# Patient Record
Sex: Female | Born: 1970 | Race: White | Hispanic: No | Marital: Married | Smoking: Never smoker
Health system: Southern US, Community
[De-identification: ages and names within clinical notes are randomized; demographics above are authoritative.]

## PROBLEM LIST (undated history)

## (undated) DIAGNOSIS — Z803 Family history of malignant neoplasm of breast: Secondary | ICD-10-CM

## (undated) DIAGNOSIS — Z800 Family history of malignant neoplasm of digestive organs: Secondary | ICD-10-CM

## (undated) DIAGNOSIS — C801 Malignant (primary) neoplasm, unspecified: Secondary | ICD-10-CM

## (undated) DIAGNOSIS — E042 Nontoxic multinodular goiter: Secondary | ICD-10-CM

## (undated) HISTORY — DX: Family history of malignant neoplasm of digestive organs: Z80.0

## (undated) HISTORY — DX: Family history of malignant neoplasm of breast: Z80.3

## (undated) HISTORY — PX: KNEE ARTHROSCOPY: SUR90

---

## 2006-01-04 ENCOUNTER — Other Ambulatory Visit: Admission: RE | Admit: 2006-01-04 | Discharge: 2006-01-04 | Payer: Self-pay | Admitting: Obstetrics & Gynecology

## 2006-01-31 ENCOUNTER — Encounter: Admission: RE | Admit: 2006-01-31 | Discharge: 2006-01-31 | Payer: Self-pay | Admitting: Emergency Medicine

## 2006-04-22 ENCOUNTER — Encounter: Admission: RE | Admit: 2006-04-22 | Discharge: 2006-04-22 | Payer: Self-pay | Admitting: Obstetrics & Gynecology

## 2007-11-21 ENCOUNTER — Encounter: Admission: RE | Admit: 2007-11-21 | Discharge: 2007-11-21 | Payer: Self-pay | Admitting: Obstetrics and Gynecology

## 2008-05-31 ENCOUNTER — Encounter: Admission: RE | Admit: 2008-05-31 | Discharge: 2008-05-31 | Payer: Self-pay | Admitting: Obstetrics and Gynecology

## 2009-01-07 ENCOUNTER — Ambulatory Visit (HOSPITAL_COMMUNITY): Admission: RE | Admit: 2009-01-07 | Discharge: 2009-01-07 | Payer: Self-pay | Admitting: Gynecology

## 2010-04-06 ENCOUNTER — Ambulatory Visit: Payer: Self-pay | Admitting: Obstetrics and Gynecology

## 2010-04-06 ENCOUNTER — Inpatient Hospital Stay (HOSPITAL_COMMUNITY): Admission: AD | Admit: 2010-04-06 | Discharge: 2010-04-06 | Payer: Self-pay | Admitting: Obstetrics and Gynecology

## 2010-04-15 ENCOUNTER — Ambulatory Visit: Payer: Self-pay | Admitting: Obstetrics & Gynecology

## 2010-04-19 ENCOUNTER — Inpatient Hospital Stay (HOSPITAL_COMMUNITY): Admission: AD | Admit: 2010-04-19 | Discharge: 2010-04-23 | Payer: Self-pay | Admitting: Obstetrics and Gynecology

## 2010-04-21 ENCOUNTER — Encounter: Payer: Self-pay | Admitting: Obstetrics and Gynecology

## 2010-04-30 ENCOUNTER — Inpatient Hospital Stay (HOSPITAL_COMMUNITY): Admission: AD | Admit: 2010-04-30 | Discharge: 2010-04-30 | Payer: Self-pay | Admitting: Obstetrics and Gynecology

## 2010-05-06 ENCOUNTER — Ambulatory Visit: Payer: Self-pay | Admitting: Obstetrics & Gynecology

## 2010-05-12 ENCOUNTER — Ambulatory Visit: Payer: Self-pay | Admitting: Obstetrics and Gynecology

## 2010-05-12 ENCOUNTER — Other Ambulatory Visit: Payer: Self-pay | Admitting: Obstetrics and Gynecology

## 2010-05-15 ENCOUNTER — Encounter (INDEPENDENT_AMBULATORY_CARE_PROVIDER_SITE_OTHER): Payer: Self-pay | Admitting: Obstetrics and Gynecology

## 2010-05-15 ENCOUNTER — Inpatient Hospital Stay (HOSPITAL_COMMUNITY): Admission: AD | Admit: 2010-05-15 | Discharge: 2010-05-18 | Payer: Self-pay | Admitting: Obstetrics and Gynecology

## 2010-05-18 ENCOUNTER — Encounter: Admission: RE | Admit: 2010-05-18 | Discharge: 2010-06-17 | Payer: Self-pay | Admitting: Obstetrics and Gynecology

## 2010-06-18 ENCOUNTER — Encounter
Admission: RE | Admit: 2010-06-18 | Discharge: 2010-07-18 | Payer: Self-pay | Source: Home / Self Care | Attending: Obstetrics and Gynecology | Admitting: Obstetrics and Gynecology

## 2010-07-19 ENCOUNTER — Encounter
Admission: RE | Admit: 2010-07-19 | Discharge: 2010-08-18 | Payer: Self-pay | Source: Home / Self Care | Attending: Obstetrics and Gynecology | Admitting: Obstetrics and Gynecology

## 2010-08-19 ENCOUNTER — Encounter
Admission: RE | Admit: 2010-08-19 | Discharge: 2010-08-27 | Payer: Self-pay | Source: Home / Self Care | Attending: Obstetrics and Gynecology | Admitting: Obstetrics and Gynecology

## 2010-10-14 LAB — CBC
HCT: 28.4 % — ABNORMAL LOW (ref 36.0–46.0)
HCT: 29.5 % — ABNORMAL LOW (ref 36.0–46.0)
Hemoglobin: 10.2 g/dL — ABNORMAL LOW (ref 12.0–15.0)
Hemoglobin: 9.6 g/dL — ABNORMAL LOW (ref 12.0–15.0)
MCH: 33.9 pg (ref 26.0–34.0)
MCHC: 34 g/dL (ref 30.0–36.0)
MCV: 100.6 fL — ABNORMAL HIGH (ref 78.0–100.0)
MCV: 99.8 fL (ref 78.0–100.0)
Platelets: 101 10*3/uL — ABNORMAL LOW (ref 150–400)
RBC: 2.94 MIL/uL — ABNORMAL LOW (ref 3.87–5.11)
RDW: 13.8 % (ref 11.5–15.5)
RDW: 14.3 % (ref 11.5–15.5)
WBC: 10.8 10*3/uL — ABNORMAL HIGH (ref 4.0–10.5)

## 2010-10-15 LAB — URINALYSIS, ROUTINE W REFLEX MICROSCOPIC
Leukocytes, UA: NEGATIVE
Specific Gravity, Urine: 1.015 (ref 1.005–1.030)
Specific Gravity, Urine: <1.005 — ABNORMAL LOW (ref 1.005–1.030)
Urobilinogen, UA: 0.2 mg/dL (ref 0.0–1.0)
pH: 6 (ref 5.0–8.0)
pH: 7 (ref 5.0–8.0)

## 2010-10-15 LAB — SURGICAL PCR SCREEN: MRSA, PCR: NEGATIVE

## 2010-10-15 LAB — CBC
Hemoglobin: 12.8 g/dL (ref 12.0–15.0)
MCH: 34.3 pg — ABNORMAL HIGH (ref 26.0–34.0)
RDW: 13.8 % (ref 11.5–15.5)
WBC: 9.4 10*3/uL (ref 4.0–10.5)

## 2010-10-15 LAB — RPR: RPR Ser Ql: NONREACTIVE

## 2010-10-15 LAB — URINE MICROSCOPIC-ADD ON

## 2010-10-15 LAB — FETAL FIBRONECTIN: Fetal Fibronectin: NEGATIVE

## 2010-10-15 LAB — WET PREP, GENITAL
Clue Cells Wet Prep HPF POC: NONE SEEN
Trich, Wet Prep: NONE SEEN
Yeast Wet Prep HPF POC: NONE SEEN

## 2011-11-23 ENCOUNTER — Other Ambulatory Visit: Payer: Self-pay | Admitting: Obstetrics and Gynecology

## 2012-02-18 ENCOUNTER — Other Ambulatory Visit: Payer: Self-pay | Admitting: Family Medicine

## 2012-02-18 DIAGNOSIS — E010 Iodine-deficiency related diffuse (endemic) goiter: Secondary | ICD-10-CM

## 2012-02-21 ENCOUNTER — Ambulatory Visit
Admission: RE | Admit: 2012-02-21 | Discharge: 2012-02-21 | Disposition: A | Discharge status: Home / Self Care | Payer: 59 | Source: Ambulatory Visit | Attending: Family Medicine | Admitting: Family Medicine

## 2012-02-21 DIAGNOSIS — E010 Iodine-deficiency related diffuse (endemic) goiter: Secondary | ICD-10-CM

## 2012-02-24 ENCOUNTER — Encounter (HOSPITAL_COMMUNITY): Payer: Self-pay | Admitting: Pharmacy Technician

## 2012-02-28 ENCOUNTER — Other Ambulatory Visit: Payer: Self-pay | Admitting: Otolaryngology

## 2012-02-28 DIAGNOSIS — E042 Nontoxic multinodular goiter: Secondary | ICD-10-CM

## 2012-02-29 ENCOUNTER — Other Ambulatory Visit (HOSPITAL_COMMUNITY)
Admission: RE | Admit: 2012-02-29 | Discharge: 2012-02-29 | Disposition: A | Discharge status: Home / Self Care | Payer: 59 | Source: Ambulatory Visit | Attending: Interventional Radiology | Admitting: Interventional Radiology

## 2012-02-29 ENCOUNTER — Ambulatory Visit
Admission: RE | Admit: 2012-02-29 | Discharge: 2012-02-29 | Disposition: A | Discharge status: Home / Self Care | Payer: 59 | Source: Ambulatory Visit | Attending: Otolaryngology | Admitting: Otolaryngology

## 2012-02-29 DIAGNOSIS — E049 Nontoxic goiter, unspecified: Secondary | ICD-10-CM | POA: Insufficient documentation

## 2012-02-29 DIAGNOSIS — E042 Nontoxic multinodular goiter: Secondary | ICD-10-CM

## 2012-03-06 ENCOUNTER — Other Ambulatory Visit: Payer: Self-pay | Admitting: Otolaryngology

## 2012-03-07 ENCOUNTER — Encounter (HOSPITAL_COMMUNITY): Payer: Self-pay

## 2012-03-07 ENCOUNTER — Encounter (HOSPITAL_COMMUNITY)
Admission: RE | Admit: 2012-03-07 | Discharge: 2012-03-07 | Disposition: A | Discharge status: Home / Self Care | Payer: 59 | Source: Ambulatory Visit | Attending: Otolaryngology | Admitting: Otolaryngology

## 2012-03-07 HISTORY — DX: Nontoxic multinodular goiter: E04.2

## 2012-03-07 HISTORY — DX: Malignant (primary) neoplasm, unspecified: C80.1

## 2012-03-07 LAB — CBC
Hemoglobin: 13.9 g/dL (ref 12.0–15.0)
MCHC: 34.2 g/dL (ref 30.0–36.0)
Platelets: 164 10*3/uL (ref 150–400)
RDW: 12.8 % (ref 11.5–15.5)

## 2012-03-07 LAB — SURGICAL PCR SCREEN
MRSA, PCR: NEGATIVE
Staphylococcus aureus: NEGATIVE

## 2012-03-07 LAB — HCG, SERUM, QUALITATIVE: Preg, Serum: NEGATIVE

## 2012-03-07 NOTE — Pre-Procedure Instructions (Signed)
21 Allison Craig  03/07/2012   Your procedure is scheduled on:  03-10-2012  Report to [facility] at 6:45 AM.  Call this number if you have problems the morning of surgery: [phone]   Remember:   Do not eat food or drink:After Midnight.     Take these medicines the morning of surgery with A SIP OF WATER: Norgestimate-ethinyl estradiol(Monomessa)   Do not wear jewelry, make-up or nail polish.  Do not wear lotions, powders, or perfumes. You may wear deodorant.  Do not shave 48 hours prior to surgery. Men may shave face and neck.  Do not bring valuables to the hospital.  Contacts, dentures or bridgework may not be worn into surgery.  Leave suitcase in the car. After surgery it may be brought to your room.  For patients admitted to the hospital, checkout time is 11:00 AM the day of discharge.       Special Instructions: CHG Shower Use Special Wash: 1/2 bottle night before surgery and 1/2 bottle morning of surgery.   Please read over the following fact sheets that you were given: Pain Booklet, MRSA Information and Surgical Site Infection Prevention

## 2012-03-07 NOTE — Progress Notes (Signed)
Dr. Jearld Fenton office notified that orders need to be signed.

## 2012-03-10 ENCOUNTER — Ambulatory Visit (HOSPITAL_COMMUNITY): Payer: 59 | Admitting: Anesthesiology

## 2012-03-10 ENCOUNTER — Encounter (HOSPITAL_COMMUNITY): Payer: Self-pay | Admitting: Anesthesiology

## 2012-03-10 ENCOUNTER — Ambulatory Visit (HOSPITAL_COMMUNITY)
Admission: RE | Admit: 2012-03-10 | Discharge: 2012-03-11 | Disposition: A | Discharge status: Home / Self Care | Payer: 59 | Source: Ambulatory Visit | Attending: Otolaryngology | Admitting: Otolaryngology

## 2012-03-10 ENCOUNTER — Encounter (HOSPITAL_COMMUNITY)
Admission: RE | Disposition: A | Discharge status: Home / Self Care | Payer: Self-pay | Source: Ambulatory Visit | Attending: Otolaryngology

## 2012-03-10 ENCOUNTER — Encounter (HOSPITAL_COMMUNITY): Payer: Self-pay | Admitting: *Deleted

## 2012-03-10 DIAGNOSIS — E042 Nontoxic multinodular goiter: Secondary | ICD-10-CM | POA: Insufficient documentation

## 2012-03-10 DIAGNOSIS — E079 Disorder of thyroid, unspecified: Secondary | ICD-10-CM

## 2012-03-10 DIAGNOSIS — Z01818 Encounter for other preprocedural examination: Secondary | ICD-10-CM | POA: Insufficient documentation

## 2012-03-10 DIAGNOSIS — Z01812 Encounter for preprocedural laboratory examination: Secondary | ICD-10-CM | POA: Insufficient documentation

## 2012-03-10 HISTORY — PX: THYROIDECTOMY: SHX17

## 2012-03-10 LAB — CALCIUM: Calcium: 8.5 mg/dL (ref 8.4–10.5)

## 2012-03-10 SURGERY — THYROIDECTOMY
Anesthesia: General | Site: Neck | Laterality: Bilateral | Wound class: Clean

## 2012-03-10 MED ORDER — DEXTROSE 5 % IV SOLN
INTRAVENOUS | Status: DC | PRN
  Administered 2012-03-10: 10:00:00 via INTRAVENOUS

## 2012-03-10 MED ORDER — SODIUM CHLORIDE 0.9 % IR SOLN
Status: DC | PRN
  Administered 2012-03-10: 1

## 2012-03-10 MED ORDER — LIDOCAINE HCL (CARDIAC) 20 MG/ML IV SOLN
INTRAVENOUS | Status: DC | PRN
  Administered 2012-03-10: 60 mg via INTRAVENOUS

## 2012-03-10 MED ORDER — HYDROCODONE-ACETAMINOPHEN 5-325 MG PO TABS
1.0000 | ORAL_TABLET | ORAL | Status: DC | PRN
Start: 2012-03-10 — End: 2012-03-11
  Administered 2012-03-10: 1 via ORAL
  Administered 2012-03-10: 2 via ORAL
  Administered 2012-03-11 (×3): 1 via ORAL
  Filled 2012-03-10 (×3): qty 1
  Filled 2012-03-10 (×2): qty 2
  Filled 2012-03-10: qty 1

## 2012-03-10 MED ORDER — DEXTROSE-NACL 5-0.45 % IV SOLN
INTRAVENOUS | Status: DC
  Administered 2012-03-10: 16:00:00 via INTRAVENOUS

## 2012-03-10 MED ORDER — PROMETHAZINE HCL 25 MG/ML IJ SOLN: 6.2500 mg | INTRAMUSCULAR | Status: DC | PRN

## 2012-03-10 MED ORDER — NEOSTIGMINE METHYLSULFATE 1 MG/ML IJ SOLN
INTRAMUSCULAR | Status: DC | PRN
  Administered 2012-03-10: 2 mg via INTRAVENOUS

## 2012-03-10 MED ORDER — ACETAMINOPHEN 10 MG/ML IV SOLN
INTRAVENOUS | Status: DC | PRN
  Administered 2012-03-10: 1000 mg via INTRAVENOUS

## 2012-03-10 MED ORDER — LACTATED RINGERS IV SOLN
INTRAVENOUS | Status: DC | PRN
  Administered 2012-03-10 (×2): via INTRAVENOUS

## 2012-03-10 MED ORDER — MEPERIDINE HCL 25 MG/ML IJ SOLN: 6.2500 mg | INTRAMUSCULAR | Status: DC | PRN

## 2012-03-10 MED ORDER — GLYCOPYRROLATE 0.2 MG/ML IJ SOLN
INTRAMUSCULAR | Status: DC | PRN
  Administered 2012-03-10: .4 mg via INTRAVENOUS

## 2012-03-10 MED ORDER — HYDROMORPHONE HCL PF 1 MG/ML IJ SOLN
0.2500 mg | INTRAMUSCULAR | Status: DC | PRN
  Administered 2012-03-10 (×3): 0.5 mg via INTRAVENOUS

## 2012-03-10 MED ORDER — MORPHINE SULFATE 2 MG/ML IJ SOLN
2.0000 mg | INTRAMUSCULAR | Status: DC | PRN
  Administered 2012-03-10: 2 mg via INTRAVENOUS
  Filled 2012-03-10: qty 1

## 2012-03-10 MED ORDER — ACETAMINOPHEN 10 MG/ML IV SOLN
INTRAVENOUS | Status: AC
  Filled 2012-03-10: qty 100

## 2012-03-10 MED ORDER — STERILE WATER FOR IRRIGATION IR SOLN
Status: DC | PRN
  Administered 2012-03-10: 1

## 2012-03-10 MED ORDER — PROPOFOL 10 MG/ML IV EMUL
INTRAVENOUS | Status: DC | PRN
  Administered 2012-03-10: 160 mg via INTRAVENOUS

## 2012-03-10 MED ORDER — FENTANYL CITRATE 0.05 MG/ML IJ SOLN
INTRAMUSCULAR | Status: DC | PRN
  Administered 2012-03-10: 100 ug via INTRAVENOUS
  Administered 2012-03-10 (×2): 50 ug via INTRAVENOUS

## 2012-03-10 MED ORDER — MIDAZOLAM HCL 5 MG/5ML IJ SOLN
INTRAMUSCULAR | Status: DC | PRN
  Administered 2012-03-10: 2 mg via INTRAVENOUS

## 2012-03-10 MED ORDER — DEXAMETHASONE SODIUM PHOSPHATE 4 MG/ML IJ SOLN
INTRAMUSCULAR | Status: DC | PRN
  Administered 2012-03-10: 8 mg via INTRAVENOUS

## 2012-03-10 MED ORDER — ONDANSETRON HCL 4 MG/2ML IJ SOLN: 4.0000 mg | Freq: Four times a day (QID) | INTRAMUSCULAR | Status: DC | PRN

## 2012-03-10 MED ORDER — LACTATED RINGERS IV SOLN
INTRAVENOUS | Status: DC
  Administered 2012-03-10: 08:00:00 via INTRAVENOUS

## 2012-03-10 MED ORDER — HYDROMORPHONE HCL PF 1 MG/ML IJ SOLN
INTRAMUSCULAR | Status: AC
  Filled 2012-03-10: qty 1

## 2012-03-10 MED ORDER — ONDANSETRON HCL 4 MG/2ML IJ SOLN
INTRAMUSCULAR | Status: DC | PRN
  Administered 2012-03-10: 4 mg via INTRAVENOUS

## 2012-03-10 MED ORDER — ROCURONIUM BROMIDE 100 MG/10ML IV SOLN
INTRAVENOUS | Status: DC | PRN
  Administered 2012-03-10: 50 mg via INTRAVENOUS

## 2012-03-10 SURGICAL SUPPLY — 63 items
ADH SKN CLS APL DERMABOND .7 (GAUZE/BANDAGES/DRESSINGS) ×1
APPLIER CLIP 9.375 SM OPEN (CLIP) ×2
APR CLP SM 9.3 20 MLT OPN (CLIP) ×1
ATTRACTOMAT 16X20 MAGNETIC DRP (DRAPES) ×1 IMPLANT
BLADE SURG 15 STRL LF DISP TIS (BLADE) IMPLANT
BLADE SURG 15 STRL SS (BLADE)
BLADE SURG ROTATE 9660 (MISCELLANEOUS) IMPLANT
CANISTER SUCTION 2500CC (MISCELLANEOUS) ×2 IMPLANT
CLEANER TIP ELECTROSURG 2X2 (MISCELLANEOUS) ×2 IMPLANT
CLIP APPLIE 9.375 SM OPEN (CLIP) ×2 IMPLANT
CLOTH BEACON ORANGE TIMEOUT ST (SAFETY) ×2 IMPLANT
CONT SPEC 4OZ CLIKSEAL STRL BL (MISCELLANEOUS) ×1 IMPLANT
CORDS BIPOLAR (ELECTRODE) ×2 IMPLANT
COVER SURGICAL LIGHT HANDLE (MISCELLANEOUS) ×2 IMPLANT
CRADLE DONUT ADULT HEAD (MISCELLANEOUS) ×1 IMPLANT
DERMABOND ADVANCED (GAUZE/BANDAGES/DRESSINGS) ×1
DERMABOND ADVANCED .7 DNX12 (GAUZE/BANDAGES/DRESSINGS) IMPLANT
DRAIN CHANNEL 15F RND FF W/TCR (WOUND CARE) IMPLANT
DRAIN HEMOVAC 1/8 X 5 (WOUND CARE) IMPLANT
DRAIN JACKSON RD 7FR 3/32 (WOUND CARE) ×1 IMPLANT
ELECT COATED BLADE 2.86 ST (ELECTRODE) ×2 IMPLANT
ELECT REM PT RETURN 9FT ADLT (ELECTROSURGICAL) ×2
ELECTRODE REM PT RTRN 9FT ADLT (ELECTROSURGICAL) ×1 IMPLANT
EVACUATOR SILICONE 100CC (DRAIN) ×2 IMPLANT
GAUZE SPONGE 4X4 16PLY XRAY LF (GAUZE/BANDAGES/DRESSINGS) ×2 IMPLANT
GLOVE BIO SURGEON STRL SZ 6.5 (GLOVE) ×1 IMPLANT
GLOVE BIOGEL PI IND STRL 6.5 (GLOVE) IMPLANT
GLOVE BIOGEL PI IND STRL 7.0 (GLOVE) IMPLANT
GLOVE BIOGEL PI INDICATOR 6.5 (GLOVE) ×1
GLOVE BIOGEL PI INDICATOR 7.0 (GLOVE) ×1
GLOVE ECLIPSE 6.5 STRL STRAW (GLOVE) ×4 IMPLANT
GLOVE ECLIPSE 7.5 STRL STRAW (GLOVE) ×2 IMPLANT
GLOVE SURG SS PI 6.5 STRL IVOR (GLOVE) ×1 IMPLANT
GLOVE SURG SS PI 7.0 STRL IVOR (GLOVE) ×1 IMPLANT
GLOVE SURG SS PI 7.5 STRL IVOR (GLOVE) ×1 IMPLANT
GOWN STRL NON-REIN LRG LVL3 (GOWN DISPOSABLE) ×9 IMPLANT
GOWN STRL REIN XL XLG (GOWN DISPOSABLE) ×1 IMPLANT
KIT BASIN OR (CUSTOM PROCEDURE TRAY) ×2 IMPLANT
KIT ROOM TURNOVER OR (KITS) ×2 IMPLANT
LOCATOR NERVE 3 VOLT (DISPOSABLE) IMPLANT
NS IRRIG 1000ML POUR BTL (IV SOLUTION) ×2 IMPLANT
PAD ARMBOARD 7.5X6 YLW CONV (MISCELLANEOUS) ×4 IMPLANT
PENCIL FOOT CONTROL (ELECTRODE) ×2 IMPLANT
PROBE NERVBE PRASS .33 (MISCELLANEOUS) ×1 IMPLANT
SHEARS HARMONIC 9CM CVD (BLADE) IMPLANT
SPECIMEN JAR MEDIUM (MISCELLANEOUS) ×1 IMPLANT
SPONGE INTESTINAL PEANUT (DISPOSABLE) IMPLANT
STAPLER VISISTAT 35W (STAPLE) ×2 IMPLANT
SUT CHROMIC 4 0 PS 2 18 (SUTURE) ×6 IMPLANT
SUT ETHILON 3 0 PS 1 (SUTURE) ×2 IMPLANT
SUT SILK 2 0 (SUTURE) ×4
SUT SILK 2-0 18XBRD TIE 12 (SUTURE) ×1 IMPLANT
SUT SILK 4 0 (SUTURE) ×2
SUT SILK 4-0 18XBRD TIE 12 (SUTURE) ×1 IMPLANT
TOWEL OR 17X24 6PK STRL BLUE (TOWEL DISPOSABLE) ×2 IMPLANT
TOWEL OR 17X26 10 PK STRL BLUE (TOWEL DISPOSABLE) ×2 IMPLANT
TRAY ENT MC OR (CUSTOM PROCEDURE TRAY) ×2 IMPLANT
TRAY FOLEY CATH 14FRSI W/METER (CATHETERS) IMPLANT
TUBE ENDOTRAC EMG 7X10.2 (MISCELLANEOUS) ×1 IMPLANT
TUBE ENDOTRAC EMG 8X11.3 (MISCELLANEOUS) IMPLANT
TUBE ENDOTRACH  EMG 6MMTUBE EN (MISCELLANEOUS)
TUBE ENDOTRACH EMG 6MMTUBE EN (MISCELLANEOUS) IMPLANT
WATER STERILE IRR 1000ML POUR (IV SOLUTION) ×2 IMPLANT

## 2012-03-10 NOTE — Anesthesia Procedure Notes (Signed)
Procedure Name: Intubation Date/Time: 03/10/2012 9:37 AM Performed by: Tyrone Nine Pre-anesthesia Checklist: Patient identified, Timeout performed, Emergency Drugs available, Suction available and Patient being monitored Patient Re-evaluated:Patient Re-evaluated prior to inductionOxygen Delivery Method: Circle system utilized Preoxygenation: Pre-oxygenation with 100% oxygen Intubation Type: IV induction Ventilation: Mask ventilation without difficulty Laryngoscope Size: Mac and 3 Grade View: Grade I Tube type: Oral Tube size: 7.0 mm Number of attempts: 1 Airway Equipment and Method: Stylet Placement Confirmation: ETT inserted through vocal cords under direct vision,  positive ETCO2,  CO2 detector and breath sounds checked- equal and bilateral Secured at: 22 cm Tube secured with: Tape Dental Injury: Teeth and Oropharynx as per pre-operative assessment

## 2012-03-10 NOTE — Anesthesia Preprocedure Evaluation (Addendum)
Anesthesia Evaluation  Patient identified by MRN, date of birth, ID band Patient awake    Reviewed: Allergy & Precautions, H&P , NPO status , Patient's Chart, lab work & pertinent test results  History of Anesthesia Complications Negative for: history of anesthetic complications  Airway Mallampati: I  Neck ROM: Full    Dental  (+) Teeth Intact and Dental Advisory Given   Pulmonary neg pulmonary ROS,      *RADIOLOGY REPORT*   Clinical Data: Preoperative respiratory exam. Thyroid mass.   CHEST - 2 VIEW   Comparison: None.   Findings: The trachea is slightly deviated to the right just above the thoracic inlet by the known left sided thyroid mass.   Heart size and vascularity are normal and the lungs are clear.  No osseous abnormality.   IMPRESSION: Tracheal deviation to the right by the left sided thyroid mass. Otherwise, normal exam.   Original Report Authenticated By: Gwynn Burly, M.D.       External Result Report    breath sounds clear to auscultation        Cardiovascular negative cardio ROS  Rhythm:Regular Rate:Normal     Neuro/Psych    GI/Hepatic negative GI ROS, Neg liver ROS,   Endo/Other  negative endocrine ROS  Renal/GU negative Renal ROS     Musculoskeletal   Abdominal   Peds  Hematology negative hematology ROS (+)   Anesthesia Other Findings Teeth intact  Reproductive/Obstetrics Post menarchial                        Anesthesia Physical Anesthesia Plan  ASA: I  Anesthesia Plan: General   Post-op Pain Management:    Induction: Intravenous  Airway Management Planned: Oral ETT  Additional Equipment:   Intra-op Plan:   Post-operative Plan: Extubation in OR  Informed Consent: I have reviewed the patients History and Physical, chart, labs and discussed the procedure including the risks, benefits and alternatives for the proposed anesthesia with the  patient or authorized representative who has indicated his/her understanding and acceptance.   Dental advisory given  Plan Discussed with: CRNA and Surgeon  Anesthesia Plan Comments:         Anesthesia Quick Evaluation

## 2012-03-10 NOTE — Progress Notes (Signed)
Patient ID: Allison Craig, female   DOB: 1971-06-01, 41 y.o.   MRN: [id number]  Post op evening check  No complaints. Voice normal. Incision excellent. JP functioning.  Stable post op. Calcium pending. Continue overnight care.

## 2012-03-10 NOTE — H&P (Signed)
Allison Craig is an 41 y.o. female.   Chief Complaint: Thyroid mass HPI: 41 year old with a large mass in the left thyroid bed completely replaces the thyroid gland. The patient wanted this side removed but also she has a 2.8 cm mass in the right side that was needle. There was some suspicious findings suggestive of possible papillary carcinoma. Given this finding she now prefers to have her entire thyroid gland removed. She understands the procedure. She is ready to proceed.  Past Medical History  Diagnosis Date  . Cancer     thyroid  . Multiple thyroid nodules     Past Surgical History  Procedure Date  . Knee arthroscopy     left knee  . Cesarean section     times  2    History reviewed. No pertinent family history. Social History:  reports that she has never smoked. She does not have any smokeless tobacco history on file. She reports that she drinks alcohol. She reports that she does not use illicit drugs.  Allergies:  Allergies  Allergen Reactions  . Penicillins     Respiratory problems (cough)  . Sulfa Antibiotics Rash    Medications Prior to Admission  Medication Sig Dispense Refill  . ibuprofen (ADVIL,MOTRIN) 200 MG tablet Take 400 mg by mouth every 6 (six) hours as needed. For pain      . norgestimate-ethinyl estradiol (MONONESSA) 0.25-35 MG-MCG tablet Take 1 tablet by mouth daily.        No results found for this or any previous visit (from the past 48 hour(s)). No results found.  Review of Systems  Constitutional: Negative.   HENT: Negative.   Eyes: Negative.   Respiratory: Negative.   Cardiovascular: Negative.   Skin: Negative.     Blood pressure 128/82, pulse 75, temperature 98.8 F (37.1 C), temperature source Oral, resp. rate 18, last menstrual period 02/21/2012, SpO2 97.00%. Physical Exam  Constitutional: She appears well-nourished.  HENT:  Nose: Nose normal.  Mouth/Throat: Oropharynx is clear and moist.  Eyes: Pupils are equal, round, and  reactive to light.  Neck: Normal range of motion. Thyromegaly present.  Cardiovascular: Normal rate.   Respiratory: Effort normal.     Assessment/Plan Thyroid mass-she's scheduled for a total thyroidectomy. She is ready to proceed.  Suzanna Obey 03/10/2012, 8:47 AM

## 2012-03-10 NOTE — Transfer of Care (Signed)
Immediate Anesthesia Transfer of Care Note  Patient: Allison Craig  Procedure(s) Performed: Procedure(s) (LRB): THYROIDECTOMY (Bilateral)  Patient Location: PACU  Anesthesia Type: General  Level of Consciousness: awake, alert , oriented and patient cooperative  Airway & Oxygen Therapy: Patient Spontanous Breathing  Post-op Assessment: Report given to PACU RN and Post -op Vital signs reviewed and stable  Post vital signs: Reviewed and stable  Complications: No apparent anesthesia complications

## 2012-03-10 NOTE — Preoperative (Signed)
Beta Blockers   Reason not to administer Beta Blockers:Not Applicable 

## 2012-03-10 NOTE — Op Note (Signed)
Preop/postop diagnosis: Thyroid mass Procedure: Total thyroidectomy with monitoring Estimated blood loss: Approximately 25 cc Asst.: Dr. Jamesetta So Anesthesia: Gen. Indications: 41 year old with a large mass in the left thyroid gland that has been diagnosed and is deviating the trachea. We discussed options of evaluation and she refers to have this mass removed. Because she has a right-sided mass as well in order to see if that side needed to be removed a needle biopsy was performed. There was some suspicion of papillary carcinoma so a total thyroidectomy was discussed and she wanted to proceed. We discussed the procedure. We discussed risks, benefits, and options and all her questions are answered and consent was obtained Operation: Patient was taken to the operating placed in the supine position and after Nims monitor endotracheal tube was prepped and draped in the usual sterile manner. The incision was made at the base of the neck with 1 finger breath above the clavicle. A superior and inferior subplatysmal flap was elevated. The retractor was positioned. The mass in the left side was extremely large bulging the strap muscles. The midline of the strap muscles was identified and dissected off the large mass. With dissection along the capsule of this mass on the left side dissection was carried laterally. The inferior parathyroid gland was adherent to the inferior aspect of the thyroid gland and was dissected free carefully making sure to preserve the blood supply. The dissection was continued along the tracheoesophageal groove where the recurrent nerve was identified it was dissected up to the insertion into the larynx. It was preserved and the dissection was then carried up superiorly removing the superior aspect of the thyroid gland along with a identification of the other parathyroid gland. This was dissected and freed and left in the patient with what appeared to be good blood supply. The appropriate  vessels were clamped and divided and sutured.  The mass was then taken off the Berry's ligament taking care to preserve the nerve. The isthmus was divided and the specimen was sent. The right side was then dissected in a similar fashion along its capsule both parathyroid glands were identified and preserved. The nerve was also identified and dissected up to the insertion into the larynx and preserved as the specimen was brought off Berry's ligament. There was a remnant lobe that was in the midline it was dissected up just above the cricoid cartilage and removed with the right thyroid specimen. Both necks were irrigated with saline and and a #7 drain was placed. The strap muscles were closed over the trachea with a 4-0 chromic and the subcutaneous tissue closed with 4-0 chromic as well as the platysma. The drain was secured with 3-0 nylon. Skin was closed with Dermabond. The patient was then awakened brought to recovery room in stable condition counts correct.

## 2012-03-10 NOTE — Anesthesia Postprocedure Evaluation (Signed)
  Anesthesia Post-op Note  Patient: Allison Craig  Procedure(s) Performed: Procedure(s) (LRB): THYROIDECTOMY (Bilateral)  Patient Location: PACU  Anesthesia Type: General  Level of Consciousness: awake and sedated  Airway and Oxygen Therapy: Patient Spontanous Breathing  Post-op Pain: mild  Post-op Assessment: Post-op Vital signs reviewed  Post-op Vital Signs: stable  Complications: No apparent anesthesia complications

## 2012-03-11 MED ORDER — ONDANSETRON 4 MG PO TBDP: 4.0000 mg | ORAL_TABLET | Freq: Three times a day (TID) | ORAL | Status: AC | PRN

## 2012-03-11 MED ORDER — HYDROCODONE-ACETAMINOPHEN 5-325 MG PO TABS: 1.0000 | ORAL_TABLET | ORAL | Status: AC | PRN

## 2012-03-11 NOTE — Discharge Summary (Signed)
  Physician Discharge Summary  Patient ID: GIBSON TELLERIA MRN: [id number] DOB/AGE: 08-Apr-1971 41 y.o.  Admit date: 03/10/2012 Discharge date: 03/11/2012  Admission Diagnoses:Thyroid mass  Discharge Diagnoses:  Active Problems:  * No active hospital problems. *    Discharged Condition: good  Hospital Course: No complications.  Consults: None  Significant Diagnostic Studies: none  Treatments: surgery: total thyroidectomy  Discharge Exam: Blood pressure 120/70, pulse 85, temperature 97.6 F (36.4 C), temperature source Oral, resp. rate 18, height 5\' 6"  (1.676 m), weight 143 lb 4.8 oz (65 kg), last menstrual period 02/21/2012, SpO2 98.00%. PHYSICAL EXAM: Incision and voice great. Calcium without any change.  Disposition:   Discharge Orders    Future Orders Please Complete By Expires   Diet - low sodium heart healthy      Increase activity slowly        Medication List  As of 03/11/2012 10:28 AM   TAKE these medications         HYDROcodone-acetaminophen 5-325 MG per tablet   Commonly known as: NORCO/VICODIN   Take 1-2 tablets by mouth every 4 (four) hours as needed.      ibuprofen 200 MG tablet   Commonly known as: ADVIL,MOTRIN   Take 400 mg by mouth every 6 (six) hours as needed. For pain      MONONESSA 0.25-35 MG-MCG tablet   Generic drug: norgestimate-ethinyl estradiol   Take 1 tablet by mouth daily.      ondansetron 4 MG disintegrating tablet   Commonly known as: ZOFRAN-ODT   Take 1 tablet (4 mg total) by mouth every 8 (eight) hours as needed for nausea.           Follow-up Information    Follow up with Suzanna Obey, MD. Schedule an appointment as soon as possible for a visit in 1 week.   Contact information:   [facility] [address], [address] [place] [address] [phone]          Signed: Serena Colonel 03/11/2012, 10:28 AM

## 2012-03-13 ENCOUNTER — Encounter (HOSPITAL_COMMUNITY): Payer: Self-pay | Admitting: Otolaryngology

## 2012-10-11 IMAGING — US US THYROID BIOPSY
1 series · 13 of 13 positions shown · non-contrast
Comparison: none

CLINICAL DATA: Left thyroid mass, with surgical resection
scheduled.  Smaller right nodule..

ULTRASOUND-GUIDED THYROID ASPIRATION BIOPSY
TECHNIQUE: Survey ultrasound was performed and the dominant lesion
in the mid right lobe was localized.  An appropriate skin entry
site was determined.  Skin was marked, then prepped with Betadine,
draped in usual sterile fashion, and infiltrated locally with 1%
lidocaine.  Under real-time ultrasound guidance, 4  passes were
made into the lesion with 25 gauge needles.  The patient tolerated
procedure well, with no immediate complications.
IMPRESSION
1.  Technically successful ultrasound-guided thyroid aspiration
biopsy

[Series 1: us thyroid biopsy · 0.07mm/px · 13 acquisitions, 13 frames shown]
[im 1/13]
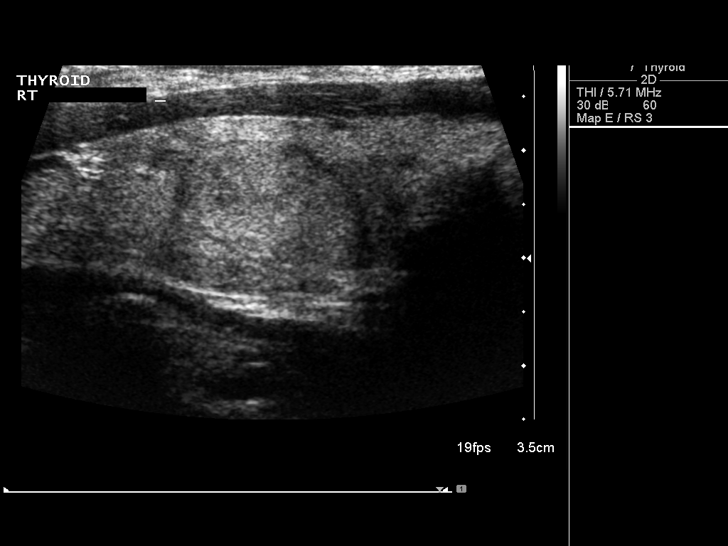
[im 2/13]
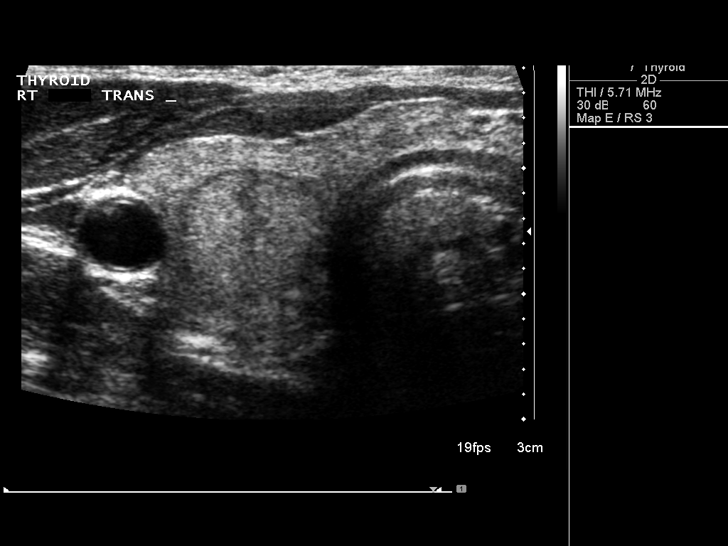
[im 3/13]
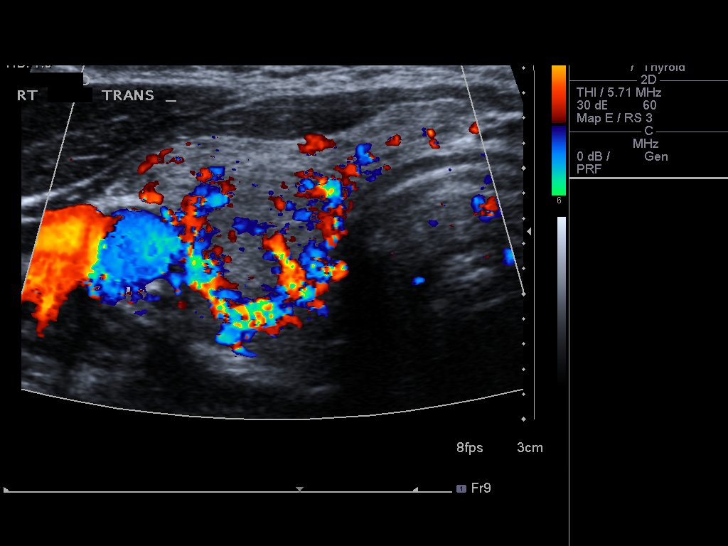
[im 4/13]
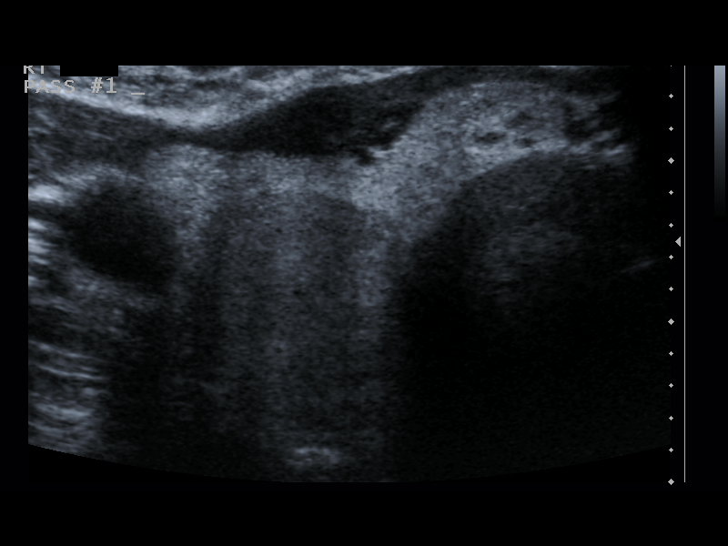
[im 5/13]
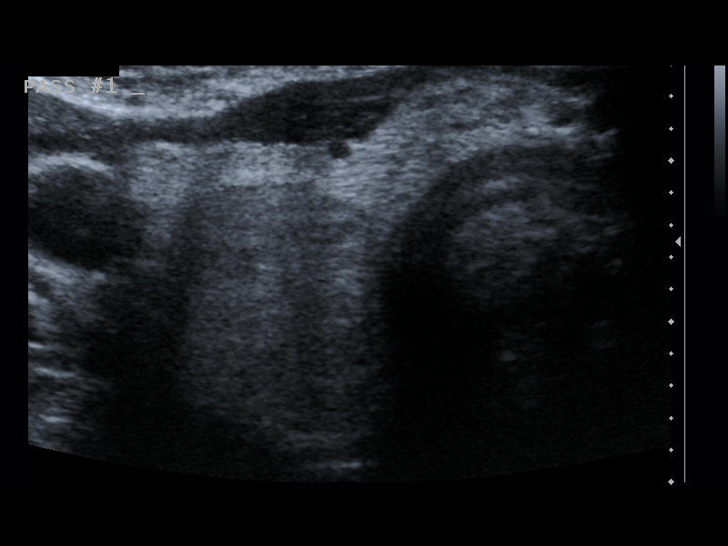
[im 6/13]
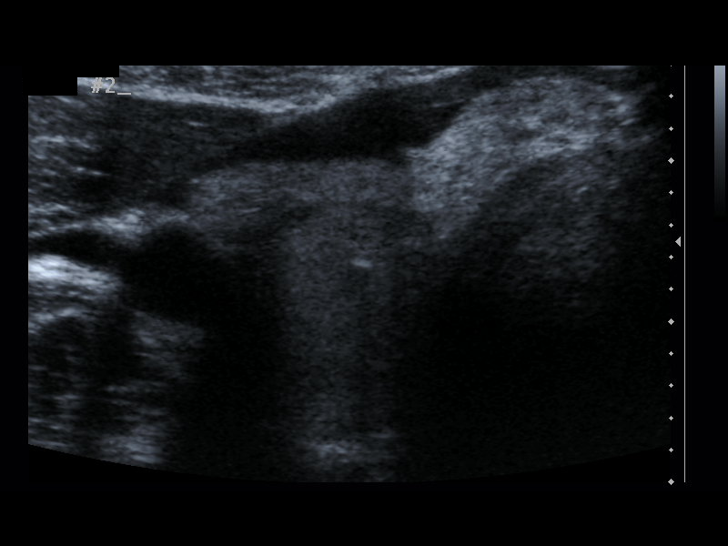
[im 7/13]
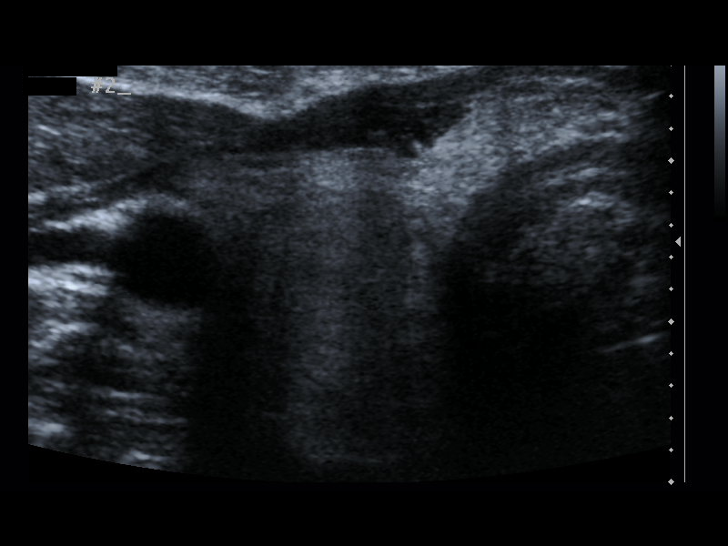
[im 8/13]
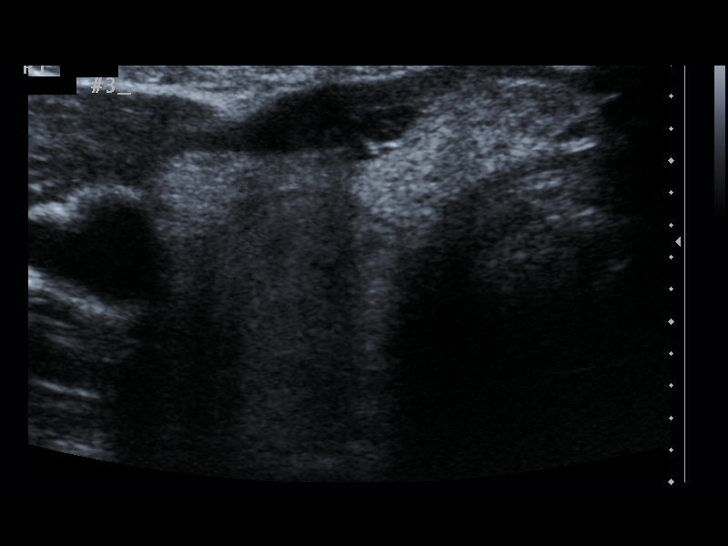
[im 9/13]
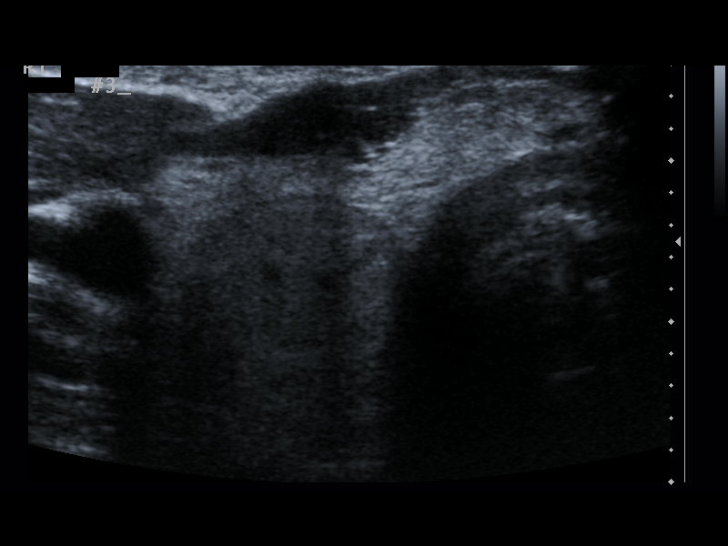
[im 10/13]
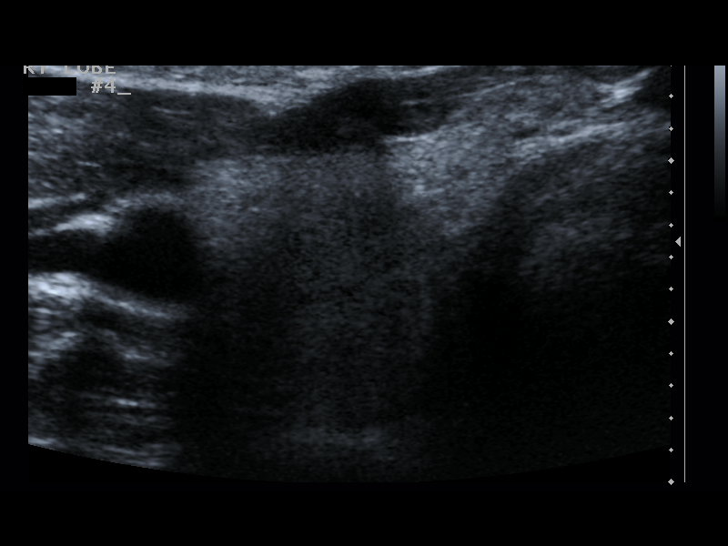
[im 11/13]
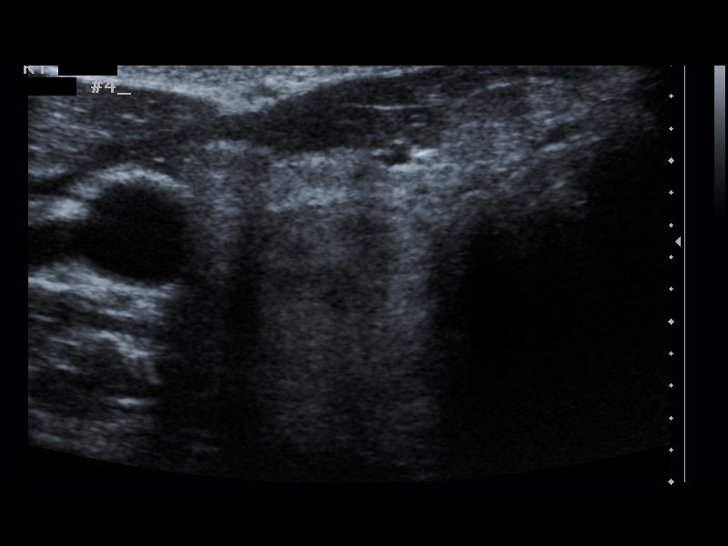
[im 12/13]
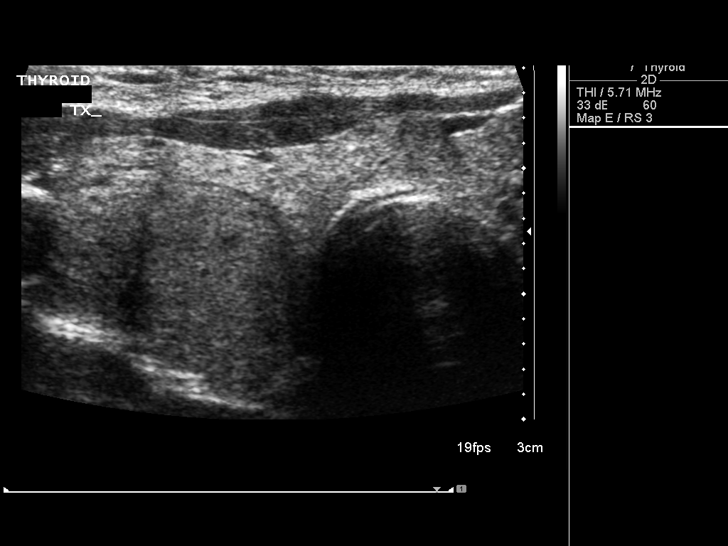
[im 13/13]
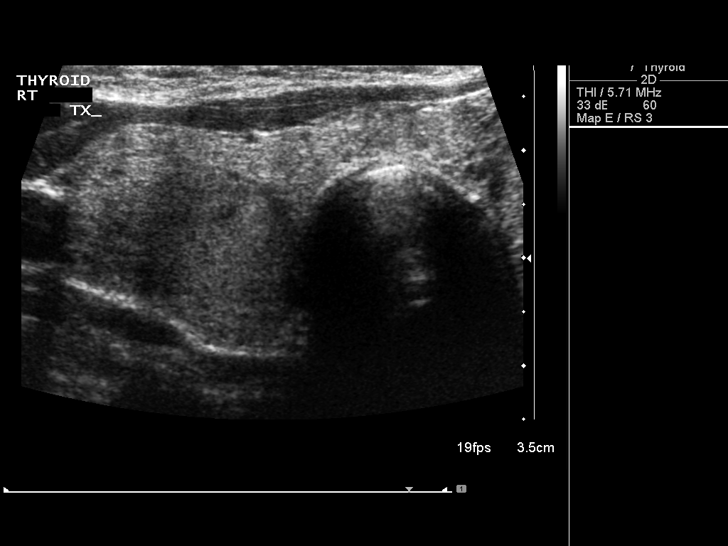

[13 of 13 positions shown; findings below may reference images not displayed]

## 2012-10-18 IMAGING — CR DG CHEST 2V
2 series · 2 of 2 positions shown · non-contrast
Comparison: None.

CLINICAL DATA: Preoperative respiratory exam. Thyroid mass.

CHEST - 2 VIEW

[view not recorded (1 of 2)]
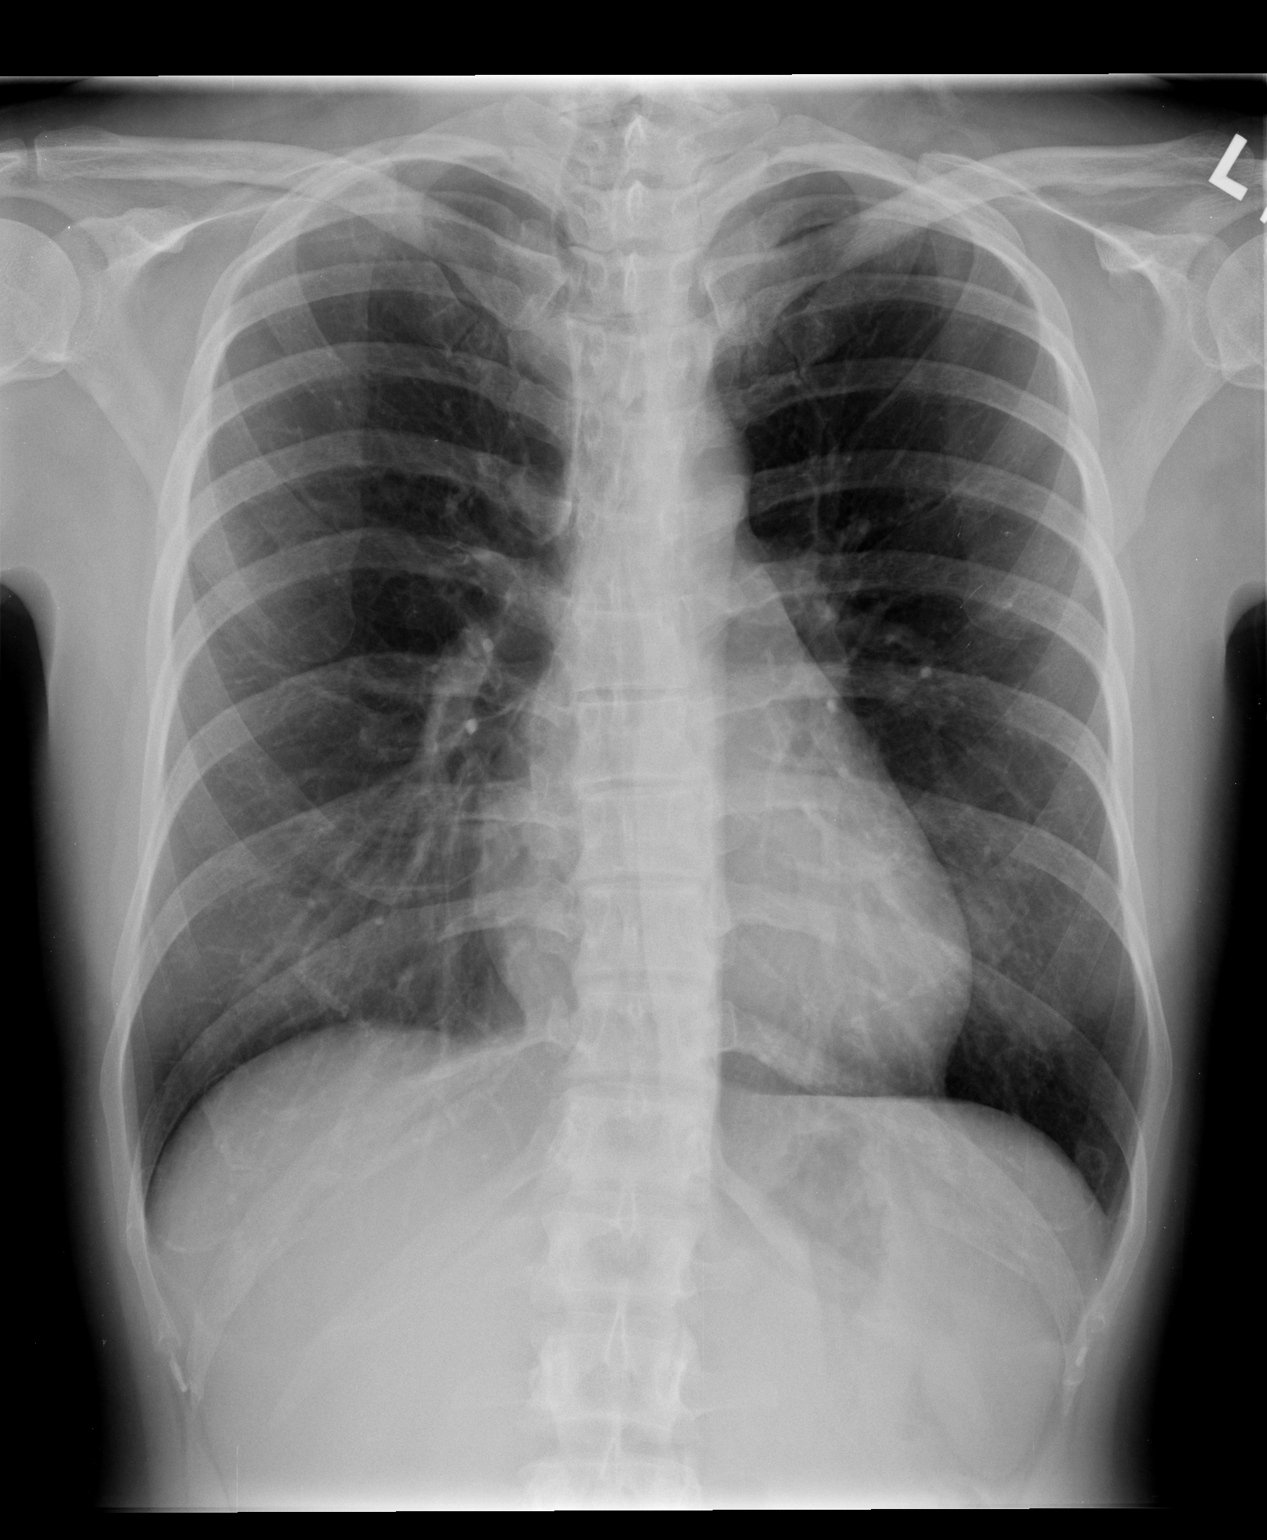

[view not recorded (2 of 2)]
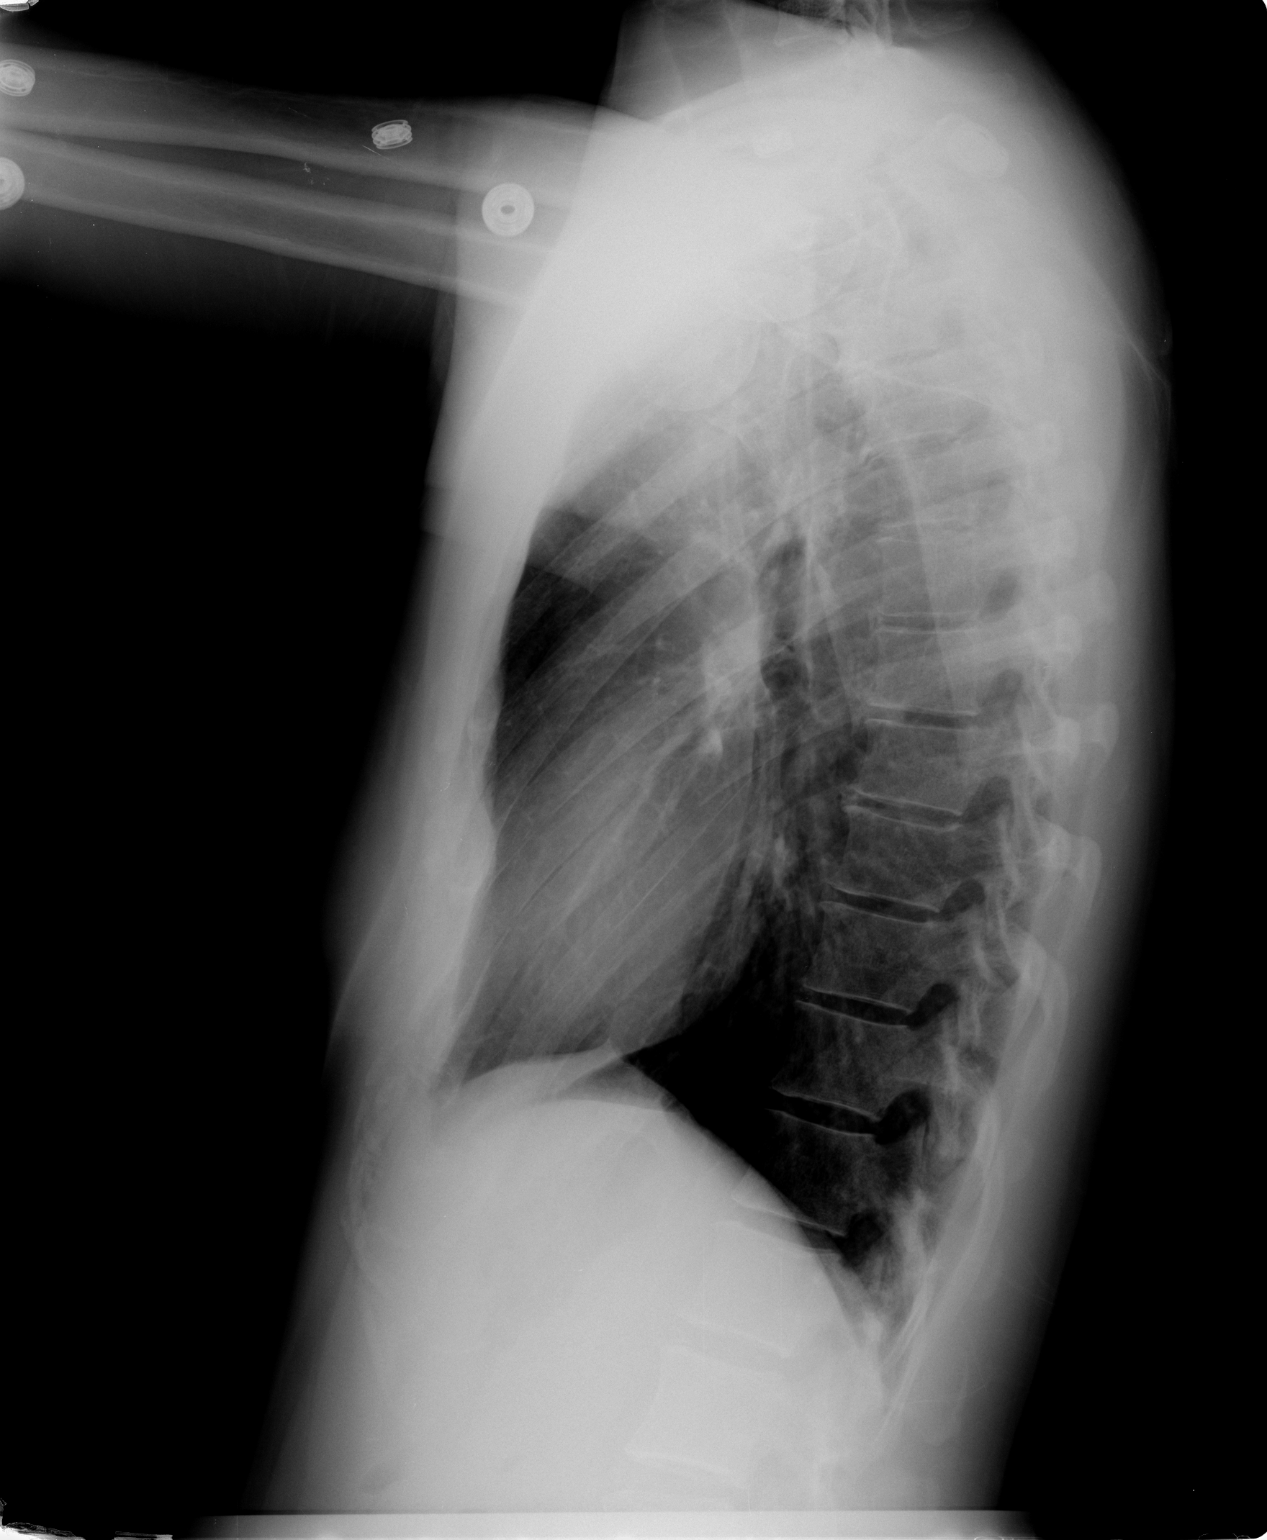

[2 of 2 positions shown; findings below may reference images not displayed]

FINDINGS: The trachea is slightly deviated to the right just above
the thoracic inlet by the known left sided thyroid mass.

Heart size and vascularity are normal and the lungs are clear.  No
osseous abnormality.
IMPRESSION: Tracheal deviation to the right by the left sided thyroid mass.
Otherwise, normal exam.

## 2013-02-21 ENCOUNTER — Other Ambulatory Visit: Payer: Self-pay | Admitting: Obstetrics and Gynecology

## 2013-03-08 ENCOUNTER — Other Ambulatory Visit: Payer: Self-pay | Admitting: Obstetrics and Gynecology

## 2013-04-11 ENCOUNTER — Other Ambulatory Visit: Payer: Self-pay | Admitting: Family Medicine

## 2013-04-11 DIAGNOSIS — Z1231 Encounter for screening mammogram for malignant neoplasm of breast: Secondary | ICD-10-CM

## 2013-06-07 ENCOUNTER — Ambulatory Visit: Payer: 59

## 2013-07-13 ENCOUNTER — Ambulatory Visit
Admission: RE | Admit: 2013-07-13 | Discharge: 2013-07-13 | Disposition: A | Discharge status: Home / Self Care | Payer: BC Managed Care – PPO | Source: Ambulatory Visit | Attending: Family Medicine | Admitting: Family Medicine

## 2013-07-13 DIAGNOSIS — Z1231 Encounter for screening mammogram for malignant neoplasm of breast: Secondary | ICD-10-CM

## 2014-05-09 ENCOUNTER — Other Ambulatory Visit: Payer: Self-pay | Admitting: Obstetrics and Gynecology

## 2014-05-10 LAB — CYTOLOGY - PAP

## 2014-06-06 ENCOUNTER — Encounter: Payer: Self-pay | Admitting: Internal Medicine

## 2014-06-06 ENCOUNTER — Ambulatory Visit (INDEPENDENT_AMBULATORY_CARE_PROVIDER_SITE_OTHER): Payer: BC Managed Care – PPO | Admitting: Internal Medicine

## 2014-06-06 VITALS — BP 112/62 | HR 94 | Temp 98.6°F | Resp 12 | Ht 66.5 in | Wt 149.0 lb

## 2014-06-06 DIAGNOSIS — E890 Postprocedural hypothyroidism: Secondary | ICD-10-CM

## 2014-06-06 LAB — TSH: TSH: 0.07 u[IU]/mL — AB (ref 0.35–4.50)

## 2014-06-06 LAB — T4, FREE: Free T4: 0.81 ng/dL (ref 0.60–1.60)

## 2014-06-06 LAB — T3, FREE: T3, Free: 4.2 pg/mL (ref 2.3–4.2)

## 2014-06-06 MED ORDER — THYROID 90 MG PO TABS: ORAL_TABLET | ORAL | Status: DC

## 2014-06-06 MED ORDER — THYROID 60 MG PO TABS: ORAL_TABLET | ORAL | Status: DC

## 2014-06-06 NOTE — Patient Instructions (Signed)
Please stop at the lab. Please come back for a follow-up appointment in 6 months, but we need to stay in touch and we may need labs sooner than that. Please try to join MyChart for easier communication.

## 2014-06-06 NOTE — Progress Notes (Signed)
Patient ID: Allison Craig, female   DOB: Dec 11, 1970, 43 y.o.   MRN: [id number]   HPI  Allison Craig is a 43 y.o.-year-old female, referred by her PCP, Dr. Chapman Fitch, for management of hypothyroidism.  Pt. Has a history of multinodular goiter, status post surgery in 2013. The cytology of one of the nodules was read as indeterminate (Bethesda class III): THYROID, FINE NEEDLE ASPIRATION, RIGHT: FINDINGS CONSISTENT WITH A FOLLICULAR NEOPLASM AND/OR LESION. SEE COMMENT. 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  However, the final pathology was benign: 1. Thyroid, lobectomy, Left - BENIGN DOMINANT HYPERPLASTIC NODULE (5 CM) WITH HURTHLE CELL CHANGES IN AN ASSOCIATED BACKGROUND OF LYMPHOCYTIC THYROIDITIS. - NO EVIDENCE OF MALIGNANCY. 2. Thyroid, lobectomy, Right - MULTINODULAR GOITER WITH ASSOCIATED LYMPHOCYTIC THYROIDITIS. - ONE BENIGN PARATHYROID IDENTIFIED. - NO EVIDENCE OF MALIGNANCY.  For her postsurgical hypothyroidism, she was initially on Synthroid 112 mcg, however, she desired to switch to Armour (skin itching), and this was started at 120 mg (the equivalent of ~200 mcg of levothyroxine!), with the subsequent low TSH checked 3 mo after the change.  I reviewed pt's thyroid tests: 05/09/2014: TSH 0.011, free T4 1.10  - checked by Dr. Runell Gess  She is taking the Armour: - fasting - with water - separated by >60 min from b'fast  - no calcium, iron, PPIs, but takes multivitamins 1 h later!  Pt describes: - + weight gain now, + craving carbs - + insomnia - + fatigue - + heat intolerance - no anxiety or depression - + constipation - no dry skin, but itching - + hair loss  Pt denies feeling nodules in neck, hoarseness, dysphagia/odynophagia, SOB with lying down.  She has no FH of thyroid disorders. + FH of thyroid cancer in mother >> in 2002, doing well. No h/o radiation tx to head or neck. No recent  use of iodine supplements.  ROS: Constitutional: see history of present illness Eyes: no blurry vision, no xerophthalmia ENT: no sore throat, no nodules palpated in throat, no dysphagia/odynophagia, no hoarseness Cardiovascular: no CP/SOB/palpitations/leg swelling Respiratory: no cough/SOB Gastrointestinal: no N/V/D/+ C Musculoskeletal: no muscle/+ joint aches Skin: no rashes, + itching, hair loss Neurological: no tremors/numbness/tingling/dizziness, + Headaches Psychiatric: no depression/anxiety + low libido  Past Medical History  Diagnosis Date  . Multiple thyroid nodules    Past Surgical History  Procedure Laterality Date  . Knee arthroscopy      left knee  . Cesarean section      times  2  . Thyroidectomy  03/10/2012    Procedure: THYROIDECTOMY;  Surgeon: Melissa Montane, MD;  Location: [facility] OR;  Service: ENT;  Laterality: Bilateral;  Total Bilateral Thyroidectomy   History   Social History  . Marital Status: Married    Spouse Name: N/A    Number of Children: 3: 53 and Twins of 49 years old    She is a Sport and exercise psychologist.  Social History Main Topics  . Smoking status: Never Smoker   . Smokeless tobacco: Not on file  . Alcohol Use: Yes     Comment: occasionally  . Drug Use: No   Current Outpatient Rx  Name  Route  Sig  Dispense  Refill  . amphetamine-dextroamphetamine (ADDERALL XR) 15 MG 24 hr capsule            0   . ARMOUR THYROID 90 MG tablet  0   . fexofenadine (ALLEGRA) 180 MG tablet   Oral   Take 180 mg by mouth daily.         Marland Kitchen ibuprofen (ADVIL,MOTRIN) 200 MG tablet   Oral   Take 400 mg by mouth every 6 (six) hours as needed. For pain         . MINASTRIN 24 FE 1-20 MG-MCG(24) CHEW               . montelukast (SINGULAIR) 10 MG tablet               . Multiple Vitamins-Minerals (ONE-A-DAY WOMENS VITACRAVES) CHEW   Oral   Chew 2 each by mouth daily.         . norgestimate-ethinyl estradiol (MONONESSA) 0.25-35 MG-MCG tablet    Oral   Take 1 tablet by mouth daily.           Allergies  Allergen Reactions  . Adhesive [Tape]     Patient states that tape used for prior surgeries caused redness of the skin.  Marland Kitchen Penicillins     Respiratory problems (cough)  . Sulfa Antibiotics Rash   FH: Per HPI  PE: BP 112/62 mmHg  Pulse 94  Temp(Src) 98.6 F (37 C) (Oral)  Resp 12  Ht 5' 6.5" (1.689 m)  Wt 149 lb (67.586 kg)  BMI 23.69 kg/m2  SpO2 97% Wt Readings from Last 3 Encounters:  06/06/14 149 lb (67.586 kg)  03/10/12 143 lb 4.8 oz (65 kg)  03/07/12 143 lb 4.8 oz (65 kg)   Constitutional: normal weight, in NAD Eyes: PERRLA, EOMI, no exophthalmos ENT: moist mucous membranes, no cervical masses, thyroid scar healed, no cervical lymphadenopathy Cardiovascular: tachycardia, RR, No MRG Respiratory: CTA B Gastrointestinal: abdomen soft, NT, ND, BS+ Musculoskeletal: no deformities, strength intact in all 4 Skin: moist, warm, no rashes Neurological: no tremor with outstretched hands, DTR normal in all 4  ASSESSMENT: 1. Hypothyroidism  PLAN:  1. Patient with 2 years history of hypothyroidism, on Armour therapy. Her latest TSH was suppressed, due to a large dose of Armour. She did and still have some hyperthyroid symptoms: insomnia, heat intolerance, increased appetite and occasional tremors. She does not appear to have neck masses or cervical lymphadenopathy - we discussed at length about the effects of taking too much thyroid hormones can have on the body. Her dose of Armour was decreased after last check from 120 mg to 90 mg, but even this dose might be too high - will check thyroid tests today: TSH, free T4 today, and we'll decrease the dose as needed - We discussed about positive and negative aspects of using Armour thyroid. I underlined the fact that:  Armour is purified from porcine thyroid glands, which is not without risk for contaminants  Also, the ratio between T4 and T3 in Armour is physiologic for  pigs, not for humans.  The short half life of T3 can cause fluctuations in blood levels, which can result in mood swings and heart rhythm abnormalities. The concentration of the active substances (T4 and T3) can be expected to vary between different Armour lots, which can cause variation in the thyroid function tests.  - We decided for now to go ahead with Armour, however we definitely need to get it to the right replacement dose - We discussed about correct intake of Armour, fasting, with water, separated by at least 30 minutes from breakfast, and separated by more than 4 hours from calcium, iron, multivitamins, acid reflux  medications (PPIs). I advised her to move the multivitamins at bedtime rather than in a.m. - If these are abnormal, she will need to return in 6-8 weeks for repeat labs - If these are normal, I will see her back in 6 months  - time spent with the patient: 45 min, of which >50% was spent in obtaining information about her symptoms, reviewing together her previous labs, evaluations, pathology and cytology reports, and treatments, counseling her about her condition (please see the discussed topics above), and developing a plan to further investigate it. She had a number of questions which I addressed.  Office Visit on 06/06/2014  Component Date Value Ref Range Status  . TSH 06/06/2014 0.07* 0.35 - 4.50 uIU/mL Final  . Free T4 06/06/2014 0.81  0.60 - 1.60 ng/dL Final  . T3, Free 06/06/2014 4.2  2.3 - 4.2 pg/mL Final   TSH still very suppressed on 90 mg of Armour daily (equivalent of 150 g of levothyroxine). Will advise the patient to start alternating 90 mg with 60 mg of Armour daily. I will send this medication to the pharmacy. We will need to repeat thyroid tests in 6 weeks.

## 2014-07-19 ENCOUNTER — Other Ambulatory Visit: Payer: BC Managed Care – PPO

## 2014-07-19 DIAGNOSIS — E890 Postprocedural hypothyroidism: Secondary | ICD-10-CM

## 2014-07-22 ENCOUNTER — Other Ambulatory Visit: Payer: BC Managed Care – PPO

## 2014-07-22 DIAGNOSIS — E079 Disorder of thyroid, unspecified: Secondary | ICD-10-CM

## 2014-07-23 LAB — T4, FREE: FREE T4: 1.03 ng/dL (ref 0.80–1.80)

## 2014-07-23 LAB — TSH: TSH: 3.162 u[IU]/mL (ref 0.350–4.500)

## 2014-07-23 LAB — T3, FREE: T3 FREE: 4.3 pg/mL — AB (ref 2.3–4.2)

## 2014-07-24 ENCOUNTER — Other Ambulatory Visit: Payer: Self-pay

## 2014-07-24 MED ORDER — THYROID 60 MG PO TABS: ORAL_TABLET | ORAL | Status: DC

## 2014-07-24 MED ORDER — THYROID 90 MG PO TABS: ORAL_TABLET | ORAL | Status: DC

## 2014-12-10 ENCOUNTER — Ambulatory Visit: Payer: BC Managed Care – PPO | Admitting: Internal Medicine

## 2014-12-11 ENCOUNTER — Ambulatory Visit (INDEPENDENT_AMBULATORY_CARE_PROVIDER_SITE_OTHER): Payer: Self-pay | Admitting: Internal Medicine

## 2014-12-11 ENCOUNTER — Encounter: Payer: Self-pay | Admitting: Internal Medicine

## 2014-12-11 VITALS — BP 102/60 | HR 80 | Temp 98.7°F | Resp 12 | Wt 150.0 lb

## 2014-12-11 DIAGNOSIS — E890 Postprocedural hypothyroidism: Secondary | ICD-10-CM

## 2014-12-11 LAB — TSH: TSH: 0.68 u[IU]/mL (ref 0.35–4.50)

## 2014-12-11 NOTE — Progress Notes (Signed)
Patient ID: Allison Craig, female   DOB: 07/19/1971, 44 y.o.   MRN: [id number]   HPI  Allison Craig is a 44 y.o.-year-old female, returning for follow-up for postsurgical hypothyroidism. Last visit was 6 months ago.  Pt. has a history of multinodular goiter, s/p thyroidectomy in 2013.   The cytology of one of the nodules was read as indeterminate (Bethesda class III): THYROID, FINE NEEDLE ASPIRATION, RIGHT: FINDINGS CONSISTENT WITH A FOLLICULAR NEOPLASM AND/OR LESION. SEE COMMENT. 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  However, the final pathology was benign: 1. Thyroid, lobectomy, Left - BENIGN DOMINANT HYPERPLASTIC NODULE (5 CM) WITH HURTHLE CELL CHANGES IN AN ASSOCIATED BACKGROUND OF LYMPHOCYTIC THYROIDITIS. - NO EVIDENCE OF MALIGNANCY. 2. Thyroid, lobectomy, Right - MULTINODULAR GOITER WITH ASSOCIATED LYMPHOCYTIC THYROIDITIS. - ONE BENIGN PARATHYROID IDENTIFIED. - NO EVIDENCE OF MALIGNANCY.  For her postsurgical hypothyroidism, she was initially on Synthroid 112 mcg, however, she desired to switch to Armour (skin itching), and this was started at 120 mg (the equivalent of ~200 mcg of levothyroxine!), Then 90 mg, and at last visit, we started to alternate 90 and 60 mg. Subsequent tests were normal, but she feels "unstable".  I reviewed pt's thyroid tests: Lab Results  Component Value Date   TSH 3.162 07/22/2014   Lab Results  Component Value Date   FREET4 1.03 07/22/2014   FREET4 0.81 06/06/2014  05/09/2014: TSH 0.011, free T4 1.10  - checked by Dr. Runell Gess  She is taking the Armour: - fasting - with water - separated by >60 min from b'fast  - no calcium, iron, PPIs, but moved multivitamins at night (not now)  Pt describes: - + weight gain - + HA - frontal - + fatigue - + heat intolerance, + sweating - new - + anxiety  - no constipation - no dry skin, but itching  - + hair  loss  Pt denies feeling nodules in neck, hoarseness, dysphagia/odynophagia, SOB with lying down.  She has + FH of thyroid cancer in mother >> in 2002, doing well.   ROS: Constitutional: see HPI Eyes: no blurry vision, no xerophthalmia ENT: no sore throat, no nodules palpated in throat, no dysphagia/odynophagia, no hoarseness Cardiovascular: no CP/SOB/palpitations/leg swelling Respiratory: no cough/SOB Gastrointestinal: no N/V/D/C Musculoskeletal: no muscle/joint aches Skin: no rashes, + itching, hair loss Neurological: no tremors/numbness/tingling/dizziness, + Headaches  I reviewed pt's medications, allergies, PMH, social hx, family hx, and changes were documented in the history of present illness. Otherwise, unchanged from my initial visit note:  Past Medical History  Diagnosis Date  . Multiple thyroid nodules    Past Surgical History  Procedure Laterality Date  . Knee arthroscopy      left knee  . Cesarean section      times  2  . Thyroidectomy  03/10/2012    Procedure: THYROIDECTOMY;  Surgeon: Melissa Montane, MD;  Location: [facility] OR;  Service: ENT;  Laterality: Bilateral;  Total Bilateral Thyroidectomy   History   Social History  . Marital Status: Married    Spouse Name: N/A    Number of Children: 3: 43 and Twins of 60 years old    She is a Sport and exercise psychologist.  Social History Main Topics  . Smoking status: Never Smoker   . Smokeless tobacco: Not on file  . Alcohol Use: Yes     Comment: occasionally  . Drug Use: No   Current Outpatient Rx  Name  Route  Sig  Dispense  Refill  . amphetamine-dextroamphetamine (ADDERALL XR) 15 MG 24 hr capsule            0   . fexofenadine (ALLEGRA) 180 MG tablet   Oral   Take 180 mg by mouth daily.         Marland Kitchen ibuprofen (ADVIL,MOTRIN) 200 MG tablet   Oral   Take 400 mg by mouth every 6 (six) hours as needed. For pain         . MINASTRIN 24 FE 1-20 MG-MCG(24) CHEW               . montelukast (SINGULAIR) 10 MG tablet                . thyroid (ARMOUR THYROID) 60 MG tablet      Take 1 tablet in the morning every other day, alternating with 90 mg tablet   30 tablet   1   . thyroid (ARMOUR THYROID) 90 MG tablet      Take 1 tablet in the morning every other day, alternating with 60 mg tablet   30 tablet   1   . Multiple Vitamins-Minerals (ONE-A-DAY WOMENS VITACRAVES) CHEW   Oral   Chew 2 each by mouth daily.         . norgestimate-ethinyl estradiol (MONONESSA) 0.25-35 MG-MCG tablet   Oral   Take 1 tablet by mouth daily.           Allergies  Allergen Reactions  . Adhesive [Tape]     Patient states that tape used for prior surgeries caused redness of the skin.  Marland Kitchen Penicillins     Respiratory problems (cough)  . Sulfa Antibiotics Rash   FH: Per HPI  PE: BP 102/60 mmHg  Pulse 80  Temp(Src) 98.7 F (37.1 C) (Oral)  Resp 12  Wt 150 lb (68.04 kg)  SpO2 96% Wt Readings from Last 3 Encounters:  12/11/14 150 lb (68.04 kg)  06/06/14 149 lb (67.586 kg)  03/10/12 143 lb 4.8 oz (65 kg)   Constitutional: normal weight, in NAD Eyes: PERRLA, EOMI, no exophthalmos ENT: moist mucous membranes, no cervical masses, thyroid scar healed, no cervical lymphadenopathy Cardiovascular: tachycardia, RR, No MRG Respiratory: CTA B Gastrointestinal: abdomen soft, NT, ND, BS+ Musculoskeletal: no deformities, strength intact in all 4 Skin: moist, warm, no rashes Neurological: no tremor with outstretched hands, DTR normal in all 4  ASSESSMENT: 1. Hypothyroidism  PLAN:  1. Patient with 2 years history of hypothyroidism, on Armour therapy. Her latest TSH was normal, but this was 5 months ago. She now complains of weight gain, or other weight redistribution, hair loss, itching on skin, irritability. She is wondering whether the fluctuation between 60 and 90 mg of Armour can be to blame for her symptoms. She does not appear to have neck masses or cervical lymphadenopathy - We discussed about switching back to  Synthroid d.a.w. when the results are back, she agrees. In the future, we can always come back to Armour, but will need a fixed dose. - We discussed about correct intake of Armour, fasting, with water, separated by at least 30 minutes from breakfast, and separated by more than 4 hours from calcium, iron, multivitamins, acid reflux medications (PPIs). She is taking it correctly - I will see her back in 6 months  Office Visit on 12/11/2014  Component Date Value Ref Range Status  . TSH 12/11/2014 0.68  0.35 - 4.50 uIU/mL Final  . Free T4 12/11/2014 0.53* 0.60 -  1.60 ng/dL Final  . T3, Free 12/11/2014 3.3  2.3 - 4.2 pg/mL Final   Thyroid tests are normal, except free T4 which is a little bit low. I will switch her from Armour (75 mg daily) to the corresponding dose of Synthroid (125 mcg daily). We'll need another set of labs in 6 weeks.

## 2014-12-11 NOTE — Patient Instructions (Signed)
Please stop at the lab.  Please come back for a follow-up appointment in 6 months.  

## 2014-12-12 LAB — T3, FREE: T3 FREE: 3.3 pg/mL (ref 2.3–4.2)

## 2014-12-12 LAB — T4, FREE: FREE T4: 0.53 ng/dL — AB (ref 0.60–1.60)

## 2014-12-13 ENCOUNTER — Telehealth: Payer: Self-pay | Admitting: *Deleted

## 2014-12-13 MED ORDER — SYNTHROID 125 MCG PO TABS: 125.0000 ug | ORAL_TABLET | Freq: Every day | ORAL | Status: DC

## 2014-12-13 NOTE — Telephone Encounter (Signed)
Pt called and requested lab results. [phone].

## 2015-01-27 ENCOUNTER — Other Ambulatory Visit (INDEPENDENT_AMBULATORY_CARE_PROVIDER_SITE_OTHER): Payer: Self-pay

## 2015-01-27 DIAGNOSIS — E890 Postprocedural hypothyroidism: Secondary | ICD-10-CM

## 2015-01-27 LAB — TSH: TSH: 0.49 u[IU]/mL (ref 0.35–4.50)

## 2015-01-27 LAB — T4, FREE: Free T4: 1.36 ng/dL (ref 0.60–1.60)

## 2015-02-04 ENCOUNTER — Telehealth: Payer: Self-pay

## 2015-02-04 NOTE — Telephone Encounter (Signed)
-----   Message from [name], MD sent at 02/03/2015  1:08 PM EDT ----- Allison Craig, can you please call pt: Normal TFTs. Continue same LT4 dose.

## 2015-02-04 NOTE — Telephone Encounter (Signed)
Left VM for pt to return call for results.

## 2015-03-13 ENCOUNTER — Other Ambulatory Visit: Payer: Self-pay | Admitting: Internal Medicine

## 2015-06-09 ENCOUNTER — Encounter: Payer: Self-pay | Admitting: Internal Medicine

## 2015-06-09 ENCOUNTER — Ambulatory Visit (INDEPENDENT_AMBULATORY_CARE_PROVIDER_SITE_OTHER): Payer: BLUE CROSS/BLUE SHIELD | Admitting: Internal Medicine

## 2015-06-09 VITALS — BP 114/66 | HR 103 | Temp 98.4°F | Resp 12 | Wt 148.8 lb

## 2015-06-09 DIAGNOSIS — E890 Postprocedural hypothyroidism: Secondary | ICD-10-CM | POA: Diagnosis not present

## 2015-06-09 DIAGNOSIS — B372 Candidiasis of skin and nail: Secondary | ICD-10-CM

## 2015-06-09 LAB — T4, FREE: Free T4: 1.45 ng/dL (ref 0.60–1.60)

## 2015-06-09 LAB — TSH: TSH: 0.37 u[IU]/mL (ref 0.35–4.50)

## 2015-06-09 LAB — HEMOGLOBIN A1C: Hgb A1c MFr Bld: 5.1 % (ref 4.6–6.5)

## 2015-06-09 MED ORDER — SYNTHROID 112 MCG PO TABS: 112.0000 ug | ORAL_TABLET | Freq: Every day | ORAL | Status: DC

## 2015-06-09 NOTE — Patient Instructions (Signed)
Please stop at the lab.  Please continue Synthroid 125 mcg daily.  Please come back for a follow-up appointment in 6 months.

## 2015-06-09 NOTE — Progress Notes (Signed)
Patient ID: Allison Craig, female   DOB: 05-30-1971, 44 y.o.   MRN: [id number]   HPI  Allison Craig is a 44 y.o.-year-old female, returning for follow-up for postsurgical hypothyroidism. Last visit was 6 months ago.  Pt. has a history of multinodular goiter, s/p thyroidectomy in 2013.   The cytology of one of the nodules was read as indeterminate (Bethesda class III): THYROID, FINE NEEDLE ASPIRATION, RIGHT: FINDINGS CONSISTENT WITH A FOLLICULAR NEOPLASM AND/OR LESION. SEE COMMENT. 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  However, the final pathology was benign: 1. Thyroid, lobectomy, Left - BENIGN DOMINANT HYPERPLASTIC NODULE (5 CM) WITH HURTHLE CELL CHANGES IN AN ASSOCIATED BACKGROUND OF LYMPHOCYTIC THYROIDITIS. - NO EVIDENCE OF MALIGNANCY. 2. Thyroid, lobectomy, Right - MULTINODULAR GOITER WITH ASSOCIATED LYMPHOCYTIC THYROIDITIS. - ONE BENIGN PARATHYROID IDENTIFIED. - NO EVIDENCE OF MALIGNANCY.  For her postsurgical hypothyroidism, she was initially on Synthroid 112 mcg, however, she desired to switch to Armour (skin itching), and this was started at 120 mg (the equivalent of ~200 mcg of levothyroxine!), then 90 mg, then alternating 90 and 60 mg. Subsequent tests were normal, but she felt "unstable".  I then suggested to switch to Synthroid 125 g daily, and subsequent tests have normalized.  I reviewed pt's thyroid tests: Lab Results  Component Value Date   TSH 0.49 01/27/2015   Lab Results  Component Value Date   FREET4 1.36 01/27/2015   FREET4 0.53* 12/11/2014   FREET4 1.03 07/22/2014   FREET4 0.81 06/06/2014  05/09/2014: TSH 0.011, free T4 1.10  - checked by Dr. Runell Gess  She is taking the Synthroid: - fasting - with water - separated by >60 min from b'fast  - no calcium, iron, PPIs - multivitamins at night  Pt describes: - no weight gain - + HA - frontal - ++ mm cramps - no  fatigue - no heat intolerance, + sweating at night - in last 2 nights - + anxiety  - no constipation - no dry skin, but itching  - + hair loss!!  Pt denies feeling nodules in neck, hoarseness, dysphagia/odynophagia, SOB with lying down.  She has + FH of thyroid cancer in mother >> in 2002, doing well.  Started Lexapro and Mononessa.  She was found to have vaginal and periareolar candida infection.   ROS: Constitutional: see HPI Eyes: no blurry vision, no xerophthalmia ENT: no sore throat, no nodules palpated in throat, no dysphagia/odynophagia, no hoarseness Cardiovascular: no CP/SOB/palpitations/leg swelling Respiratory: no cough/SOB Gastrointestinal: no N/V/D/C Musculoskeletal: + muscle/no joint aches Skin: no rashes, no itching, + hair loss Neurological: no tremors/numbness/tingling/dizziness, + Headaches  I reviewed pt's medications, allergies, PMH, social hx, family hx, and changes were documented in the history of present illness. Otherwise, unchanged from my initial visit note:  Past Medical History  Diagnosis Date  . Multiple thyroid nodules    Past Surgical History  Procedure Laterality Date  . Knee arthroscopy      left knee  . Cesarean section      times  2  . Thyroidectomy  03/10/2012    Procedure: THYROIDECTOMY;  Surgeon: Melissa Montane, MD;  Location: [facility] OR;  Service: ENT;  Laterality: Bilateral;  Total Bilateral Thyroidectomy   History   Social History  . Marital Status: Married    Spouse Name: N/A    Number of Children: 3    She is a Sport and exercise psychologist.  Social History Main Topics  .  Smoking status: Never Smoker   . Smokeless tobacco: Not on file  . Alcohol Use: Yes     Comment: occasionally  . Drug Use: No   Current Outpatient Rx  Name  Route  Sig  Dispense  Refill  . amphetamine-dextroamphetamine (ADDERALL XR) 15 MG 24 hr capsule            0   . fexofenadine (ALLEGRA) 180 MG tablet   Oral   Take 180 mg by mouth daily.         Marland Kitchen  ibuprofen (ADVIL,MOTRIN) 200 MG tablet   Oral   Take 400 mg by mouth every 6 (six) hours as needed. For pain         . MINASTRIN 24 FE 1-20 MG-MCG(24) CHEW               . montelukast (SINGULAIR) 10 MG tablet               . Multiple Vitamins-Minerals (ONE-A-DAY WOMENS VITACRAVES) CHEW   Oral   Chew 2 each by mouth daily.         . norgestimate-ethinyl estradiol (MONONESSA) 0.25-35 MG-MCG tablet   Oral   Take 1 tablet by mouth daily.         Marland Kitchen SYNTHROID 125 MCG tablet      TAKE 1 TABLET(125 MCG) BY MOUTH DAILY BEFORE BREAKFAST   45 tablet   2     Dispense as written.     Allergies  Allergen Reactions  . Adhesive [Tape]     Patient states that tape used for prior surgeries caused redness of the skin.  Marland Kitchen Penicillins     Respiratory problems (cough)  . Sulfa Antibiotics Rash   FH: Per HPI  PE: BP 114/66 mmHg  Pulse 103  Temp(Src) 98.4 F (36.9 C) (Oral)  Resp 12  Wt 148 lb 12.8 oz (67.495 kg)  SpO2 96% Body mass index is 23.66 kg/(m^2). Wt Readings from Last 3 Encounters:  06/09/15 148 lb 12.8 oz (67.495 kg)  12/11/14 150 lb (68.04 kg)  06/06/14 149 lb (67.586 kg)   Constitutional: normal weight, in NAD Eyes: PERRLA, EOMI, no exophthalmos ENT: moist mucous membranes, no cervical masses, thyroid scar healed, no cervical lymphadenopathy Cardiovascular: tachycardia, RR, No MRG Respiratory: CTA B Gastrointestinal: abdomen soft, NT, ND, BS+ Musculoskeletal: no deformities, strength intact in all 4 Skin: moist, warm, no rashes Neurological: mild tremor with outstretched hands, DTR +3 in all 4  ASSESSMENT: 1. Hypothyroidism  2. Candida skin infection  PLAN:  1. Patient with 3 years history of hypothyroidism, previously on Armour therapy, now on Synthroid. Her latest TFTs were normal in 01/2015. She feels better on the Synthroid but has some possible sxs of higher LT4 replacement: tachycardia, hot flushes at night, mild tremors, hair loss. She  does not appear to have neck masses or cervical lymphadenopathy - We discussed about correct intake of thyroid hormone, fasting, with water, separated by at least 30 minutes from breakfast, and separated by more than 4 hours from calcium, iron, multivitamins, acid reflux medications (PPIs). She is taking it correctly - will check TFTs today, may need to decrease Synthroid dose - I will see her back in 6 months  2. Candida skin inf - just completed a diflucan course - will check a HbA1c today  Component     Latest Ref Rng 06/09/2015  TSH     0.35 - 4.50 uIU/mL 0.37  Free T4     0.60 -  1.60 ng/dL 1.45  Hemoglobin A1C     4.6 - 6.5 % 5.1  TSH slightly suppressed, since she appears mildly thyrotoxic, I will suggest to decrease the dose of Synthroid to 112 g daily. Will need to repeat her thyroid tests in 2 months. Hemoglobin A1c normal.

## 2015-06-13 ENCOUNTER — Ambulatory Visit: Payer: Self-pay | Admitting: Internal Medicine

## 2015-09-04 ENCOUNTER — Other Ambulatory Visit (INDEPENDENT_AMBULATORY_CARE_PROVIDER_SITE_OTHER): Payer: BLUE CROSS/BLUE SHIELD

## 2015-09-04 DIAGNOSIS — E890 Postprocedural hypothyroidism: Secondary | ICD-10-CM

## 2015-09-04 LAB — T3, FREE: T3, Free: 3.2 pg/mL (ref 2.3–4.2)

## 2015-09-04 LAB — TSH: TSH: 1.01 u[IU]/mL (ref 0.35–4.50)

## 2015-09-04 LAB — T4, FREE: FREE T4: 1.3 ng/dL (ref 0.60–1.60)

## 2015-12-01 ENCOUNTER — Other Ambulatory Visit: Payer: Self-pay | Admitting: Internal Medicine

## 2015-12-08 ENCOUNTER — Ambulatory Visit: Payer: BLUE CROSS/BLUE SHIELD | Admitting: Internal Medicine

## 2016-01-08 ENCOUNTER — Encounter: Payer: Self-pay | Admitting: Internal Medicine

## 2016-01-08 ENCOUNTER — Ambulatory Visit (INDEPENDENT_AMBULATORY_CARE_PROVIDER_SITE_OTHER): Payer: BLUE CROSS/BLUE SHIELD | Admitting: Internal Medicine

## 2016-01-08 VITALS — BP 126/79 | HR 115 | Temp 100.0°F | Ht 66.5 in | Wt 155.0 lb

## 2016-01-08 DIAGNOSIS — E890 Postprocedural hypothyroidism: Secondary | ICD-10-CM | POA: Diagnosis not present

## 2016-01-08 MED ORDER — SYNTHROID 100 MCG PO TABS: 100.0000 ug | ORAL_TABLET | Freq: Every day | ORAL | Status: DC

## 2016-01-08 NOTE — Progress Notes (Signed)
Patient ID: Allison Craig, female   DOB: Nov 09, 1970, 45 y.o.   MRN: [id number]   HPI  Allison Craig is a 45 y.o.-year-old female, returning for follow-up for postsurgical hypothyroidism. Last visit was 6 months ago.   Pt. has a history of multinodular goiter, s/p thyroidectomy in 2013.   The cytology of one of the nodules was read as indeterminate (Bethesda class III): THYROID, FINE NEEDLE ASPIRATION, RIGHT: FINDINGS CONSISTENT WITH A FOLLICULAR NEOPLASM AND/OR LESION. SEE COMMENT. 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  However, the final pathology was benign: 1. Thyroid, lobectomy, Left - BENIGN DOMINANT HYPERPLASTIC NODULE (5 CM) WITH HURTHLE CELL CHANGES IN AN ASSOCIATED BACKGROUND OF LYMPHOCYTIC THYROIDITIS. - NO EVIDENCE OF MALIGNANCY. 2. Thyroid, lobectomy, Right - MULTINODULAR GOITER WITH ASSOCIATED LYMPHOCYTIC THYROIDITIS. - ONE BENIGN PARATHYROID IDENTIFIED. - NO EVIDENCE OF MALIGNANCY.  For her postsurgical hypothyroidism, she was initially on Synthroid 112 mcg, however, she desired to switch to Armour (skin itching), and this was started at 120 mg (the equivalent of ~200 mcg of levothyroxine!), then 90 mg, then alternating 90 and 60 mg. Subsequent tests were normal, but she felt "unstable". I then suggested to switch to Synthroid 125 g daily, and subsequent tests have normalized, but at last visit >> TSH was low-normal >> we decreased dose to 112 mcg daily. Last level was normal in 09/2015.  I reviewed pt's thyroid tests: Lab Results  Component Value Date   TSH 1.01 09/04/2015   Lab Results  Component Value Date   FREET4 1.30 09/04/2015   FREET4 1.45 06/09/2015   FREET4 1.36 01/27/2015   FREET4 0.53* 12/11/2014   FREET4 1.03 07/22/2014  05/09/2014: TSH 0.011, free T4 1.10  - checked by Dr. Runell Gess  She is taking the Synthroid: - fasting - with water - separated by >60 min from  b'fast  - no calcium, iron, PPIs - multivitamins at night  Was on Biotin >> stopped.  Pt feels much better after decreasing the LT4 dose: - no fatigue - + weight gain - less tremors - improved concentration - less hair loss - less sweating at night  - less anxiety  - no constipation - no dry skin  Pt denies feeling nodules in neck, hoarseness, dysphagia/odynophagia, SOB with lying down.  She has + FH of thyroid cancer in mother >> in 2002, doing well.  Switched from Lexapro >> Lamictal >> caused metrorrhagia >> stopped Lamictal >> still some bleeding .  ROS: Constitutional: see HPI Eyes: no blurry vision, no xerophthalmia ENT: no sore throat, no nodules palpated in throat, no dysphagia/odynophagia, no hoarseness Cardiovascular: no CP/SOB/palpitations/leg swelling Respiratory: no cough/SOB Gastrointestinal: no N/V/D/C Musculoskeletal: no muscle/no joint aches Skin: no rashes, no itching, + hair loss Neurological: no tremors/numbness/tingling/dizziness  I reviewed pt's medications, allergies, PMH, social hx, family hx, and changes were documented in the history of present illness. Otherwise, unchanged from my initial visit note:  Past Medical History  Diagnosis Date  . Multiple thyroid nodules    Past Surgical History  Procedure Laterality Date  . Knee arthroscopy      left knee  . Cesarean section      times  2  . Thyroidectomy  03/10/2012    Procedure: THYROIDECTOMY;  Surgeon: Melissa Montane, MD;  Location: [facility] OR;  Service: ENT;  Laterality: Bilateral;  Total Bilateral Thyroidectomy   History   Social History  . Marital Status: Married  Spouse Name: N/A    Number of Children: 3    She is a Sport and exercise psychologist.  Social History Main Topics  . Smoking status: Never Smoker   . Smokeless tobacco: Not on file  . Alcohol Use: Yes     Comment: occasionally  . Drug Use: No   Current Outpatient Rx  Name  Route  Sig  Dispense  Refill  . amphetamine-dextroamphetamine  (ADDERALL XR) 20 MG 24 hr capsule      TK 1 C PO QD      0   . fexofenadine (ALLEGRA) 180 MG tablet   Oral   Take 180 mg by mouth daily.         Marland Kitchen ibuprofen (ADVIL,MOTRIN) 200 MG tablet   Oral   Take 400 mg by mouth every 6 (six) hours as needed. For pain         . montelukast (SINGULAIR) 10 MG tablet               . Multiple Vitamins-Minerals (CENTRUM PO)   Oral   Take 1 capsule by mouth daily. For Women         . norgestimate-ethinyl estradiol (MONONESSA) 0.25-35 MG-MCG tablet   Oral   Take 1 tablet by mouth daily.         Marland Kitchen PROAIR HFA 108 (90 BASE) MCG/ACT inhaler                 Dispense as written.   Marland Kitchen QVAR 40 MCG/ACT inhaler                 Dispense as written.   Marland Kitchen SYNTHROID 112 MCG tablet   Oral   Take 1 tablet (112 mcg total) by mouth daily before breakfast.   60 tablet   1     Dispense as written.   Marland Kitchen MINASTRIN 24 FE 1-20 MG-MCG(24) CHEW      Reported on 01/08/2016           Allergies  Allergen Reactions  . Adhesive [Tape]     Patient states that tape used for prior surgeries caused redness of the skin.  Marland Kitchen Penicillins     Respiratory problems (cough)  . Sulfa Antibiotics Rash   FH: Per HPI  PE: BP 126/79 mmHg  Pulse 115  Temp(Src) 100 F (37.8 C) (Oral)  Ht 5' 6.5" (1.689 m)  Wt 155 lb (70.308 kg)  BMI 24.65 kg/m2  SpO2 97%  LMP 12/31/2015 Body mass index is 24.65 kg/(m^2). Wt Readings from Last 3 Encounters:  01/08/16 155 lb (70.308 kg)  06/09/15 148 lb 12.8 oz (67.495 kg)  12/11/14 150 lb (68.04 kg)   Constitutional: normal weight, in NAD Eyes: PERRLA, EOMI, no exophthalmos ENT: moist mucous membranes, no cervical masses, thyroid scar healed, no cervical lymphadenopathy Cardiovascular: tachycardia, RR, No MRG Respiratory: CTA B Gastrointestinal: abdomen soft, NT, ND, BS+ Musculoskeletal: no deformities, strength intact in all 4 Skin: moist, warm, no rashes Neurological: mild tremor with outstretched hands,  DTR +3 in all 4  ASSESSMENT: 1. Hypothyroidism - postsurgical  PLAN:  1. Patient with history of hypothyroidism, previously on Armour therapy, now on Synthroid DAW. Her latest TFTs were normal in 09/2015, after changing her LT4 dose from 125 to 112 mcg daily in 06/2015. At that time, she had some possible hyperthyroid sxs: anxiety, tachycardia, tremors, insomnia, hot flushes, hair loss >> now all better. She does not appear to have neck masses or cervical lymphadenopathy - We discussed about  correct intake of thyroid hormone, fasting, with water, separated by at least 30 minutes from breakfast, and separated by more than 4 hours from calcium, iron, multivitamins, acid reflux medications (PPIs). She is taking it correctly. - reviewed last TFTs from 09/2015 >> she would like to try to decrease the dose of Synthroid a little more >> we will decrease the dose to 100 mcg daily and have her back for labs in 6 weeks. - I will see her back in 6 months

## 2016-01-08 NOTE — Patient Instructions (Addendum)
Please decrease the Synthroid dose to 100 mcg.   Take the thyroid hormone every day, with water, at least 30 minutes before breakfast, separated by at least 4 hours from: - acid reflux medications - calcium - iron - multivitamins  Please come back for labs in 1.5 weeks and for a visit in 6 months.

## 2016-01-08 NOTE — Progress Notes (Signed)
Pre visit review using our clinic tool,if applicable. No additional management support is needed unless otherwise documented below in the visit note.  

## 2016-04-06 ENCOUNTER — Other Ambulatory Visit: Payer: Self-pay | Admitting: Internal Medicine

## 2016-05-10 ENCOUNTER — Other Ambulatory Visit (INDEPENDENT_AMBULATORY_CARE_PROVIDER_SITE_OTHER): Payer: BLUE CROSS/BLUE SHIELD

## 2016-05-10 DIAGNOSIS — E890 Postprocedural hypothyroidism: Secondary | ICD-10-CM | POA: Diagnosis not present

## 2016-05-10 LAB — TSH: TSH: 17.61 u[IU]/mL — AB (ref 0.35–4.50)

## 2016-05-10 LAB — T4, FREE: FREE T4: 0.84 ng/dL (ref 0.60–1.60)

## 2016-05-12 ENCOUNTER — Encounter: Payer: Self-pay | Admitting: Internal Medicine

## 2016-05-12 ENCOUNTER — Other Ambulatory Visit: Payer: Self-pay | Admitting: Internal Medicine

## 2016-05-12 DIAGNOSIS — E890 Postprocedural hypothyroidism: Secondary | ICD-10-CM

## 2016-05-12 MED ORDER — SYNTHROID 112 MCG PO TABS: 112.0000 ug | ORAL_TABLET | Freq: Every day | ORAL | 1 refills | Status: DC

## 2016-06-21 ENCOUNTER — Encounter: Payer: Self-pay | Admitting: Genetic Counselor

## 2016-06-21 ENCOUNTER — Telehealth: Payer: Self-pay | Admitting: Genetic Counselor

## 2016-06-21 NOTE — Telephone Encounter (Signed)
Pt called in to schedule and confirm genetic appt, verified demo and insurance, mailed pt letter, faxed referring provider appt date/time.

## 2016-07-12 ENCOUNTER — Other Ambulatory Visit: Payer: Self-pay | Admitting: Internal Medicine

## 2016-07-12 ENCOUNTER — Encounter: Payer: Self-pay | Admitting: Internal Medicine

## 2016-07-12 MED ORDER — SYNTHROID 137 MCG PO TABS: 137.0000 ug | ORAL_TABLET | Freq: Every day | ORAL | 1 refills | Status: DC

## 2016-08-04 ENCOUNTER — Other Ambulatory Visit: Payer: BLUE CROSS/BLUE SHIELD

## 2016-08-04 ENCOUNTER — Encounter: Payer: Self-pay | Admitting: Genetic Counselor

## 2016-08-04 ENCOUNTER — Ambulatory Visit (HOSPITAL_BASED_OUTPATIENT_CLINIC_OR_DEPARTMENT_OTHER): Payer: BLUE CROSS/BLUE SHIELD | Admitting: Genetic Counselor

## 2016-08-04 DIAGNOSIS — Z803 Family history of malignant neoplasm of breast: Secondary | ICD-10-CM | POA: Diagnosis not present

## 2016-08-04 DIAGNOSIS — Z315 Encounter for genetic counseling: Secondary | ICD-10-CM

## 2016-08-04 DIAGNOSIS — Z801 Family history of malignant neoplasm of trachea, bronchus and lung: Secondary | ICD-10-CM

## 2016-08-04 DIAGNOSIS — Z800 Family history of malignant neoplasm of digestive organs: Secondary | ICD-10-CM | POA: Diagnosis not present

## 2016-08-04 DIAGNOSIS — Z808 Family history of malignant neoplasm of other organs or systems: Secondary | ICD-10-CM

## 2016-08-04 NOTE — Progress Notes (Signed)
REFERRING PROVIDER: Arvella Nigh, MD [address], [place] [address]   Dr. Milford Cage  PRIMARY PROVIDER:  No primary care provider on file.  PRIMARY REASON FOR VISIT:  1. Family history of breast cancer   2. Family history of colon cancer      HISTORY OF PRESENT ILLNESS:   Allison Craig, a 46 y.o. female, was seen for a  cancer genetics consultation at the request of Dr. Milford Cage due to a family history of cancer.  Allison Craig presents to clinic today to discuss the possibility of a hereditary predisposition to cancer, genetic testing, and to further clarify her future cancer risks, as well as potential cancer risks for family members. Allison Craig is a 46 y.o. female with no personal history of cancer.  She has a family history of breast cancer.  Her maternal aunt reportedly is negative for BRCA mutations.  CANCER HISTORY:   No history exists.     HORMONAL RISK FACTORS:  Menarche was at age 87.  First live birth at age 74.  OCP use for approximately 7 years.  Ovaries intact: yes.  Hysterectomy: no.  Menopausal status: premenopausal.  HRT use: 0 years. Colonoscopy: no; not examined. Mammogram within the last year: yes. Number of breast biopsies: 0. Up to date with pelvic exams:  yes. Any excessive radiation exposure in the past:  no  Past Medical History:  Diagnosis Date  . Cancer ([place])    thyroid  . Family history of breast cancer   . Family history of colon cancer   . Multiple thyroid nodules     Past Surgical History:  Procedure Laterality Date  . CESAREAN SECTION     times  2  . KNEE ARTHROSCOPY     left knee  . THYROIDECTOMY  03/10/2012   Procedure: THYROIDECTOMY;  Surgeon: Melissa Montane, MD;  Location: [facility];  Service: ENT;  Laterality: Bilateral;  Total Bilateral Thyroidectomy    Social History   Social History  . Marital status: Married    Spouse name: N/A  . Number of children: N/A  . Years of education: N/A   Social History Main  Topics  . Smoking status: Never Smoker  . Smokeless tobacco: Never Used  . Alcohol use Yes     Comment: occasionally  . Drug use: No  . Sexual activity: Not Asked   Other Topics Concern  . None   Social History Narrative  . None     FAMILY HISTORY:  We obtained a detailed, 4-generation family history.  Significant diagnoses are listed below: Family History  Problem Relation Age of Onset  . Breast cancer Mother 14  . Thyroid cancer Mother 34    mix of papillary and follicular  . Lung cancer Maternal Uncle     smoker  . Heart failure Maternal Grandmother   . Heart disease Maternal Grandfather   . Stroke Maternal Grandfather   . Breast cancer Maternal Aunt 60  . Breast cancer Maternal Aunt 68  . Breast cancer Maternal Aunt 65    BRCA neg  . Colon cancer Maternal Uncle   . Testicular cancer Cousin 8    The patient has two daughters and a son who are cancer free.  She is an only child.  She does not have any information about her father or his family.  Her mother was diagnosed with breast cancer at age 79 and papillary/follicular thyroid cancer at age 78.  Her mother has four sisters and  six brothers.  Three sisters developed breast cancer in their 98's, a brother developed colon cancer in his 8's and another brother died of lung cancer.  One maternal cousin was diagnosed with testicular cancer at 53.  There is no other reported cancer in the family.  Allison Craig is aware of previous genetic testing for hereditary cancer risks performed in her maternal aunt.  It was reportedly negative and performed around 2014. Patient's maternal ancestors are of [place] and [place] descent, and paternal ancestors are of unknown Caucasian descent. There is no reported Ashkenazi Jewish ancestry. There is no known consanguinity.  GENETIC COUNSELING ASSESSMENT: RONNIE MALLETTE is a 46 y.o. female with a family history of breast cancer which is somewhat suggestive of a hereditary cancer syndrome and  predisposition to cancer. We, therefore, discussed and recommended the following at today's visit.   DISCUSSION: We discussed that 5-10% of breast cancer is due to hereditary causes, with most cases due to BRCA mutations.  Hereditary breast cancer genes associated with an increased risk for breast cancer outside of BRCA mutations, include ATM, CHEK2 and PALB2.  We reviewed the characteristics, features and inheritance patterns of hereditary cancer syndromes. We also discussed genetic testing, including the appropriate family members to test, the process of testing, insurance coverage and turn-around-time for results. We discussed the implications of a negative, positive and/or variant of uncertain significant result. We recommended Allison Craig pursue genetic testing for the Breast/GYN gene panel. The Breast/GYN gene panel offered by [facility] includes sequencing and rearrangement analysis for the following 23 genes:  ATM, BARD1, BRCA1, BRCA2, BRIP1, CDH1, CHEK2, EPCAM, FANCC, MLH1, MSH2, MSH6, MUTYH, NBN, NF1, PALB2, PMS2, POLD1, PTEN, RAD51C, RAD51D, RECQL, and TP53.     Based on Allison Craig's family history of cancer, she meets medical criteria for genetic testing. Despite that she meets criteria, she may still have an out of pocket cost. We discussed that if her out of pocket cost for testing is over $100, the laboratory will call and confirm whether she wants to proceed with testing.  If the out of pocket cost of testing is less than $100 she will be billed by the genetic testing laboratory.   In order to estimate her chance of having a BRCA mutation, we used statistical models ([employer]) and laboratory data that take into account her personal medical history, family history and ancestry.  Because each model is different, there can be a lot of variability in the risks they give.  Therefore, these numbers must be considered a rough range and not a precise risk of having a BRCA mutation.  These  models estimate that she has approximately a 0.39-6% chance of having a mutation. Based on this assessment of her family and personal history, genetic testing is recommended.  Based on the patient's personal and family history, statistical models (Tyrer Cusik)  and literature data were used to estimate her risk of developing breast cancer. These estimate her lifetime risk of developing breast cancer to be approximately 39.8%. This estimation does not take into account any genetic testing results.  The patient's lifetime breast cancer risk is a preliminary estimate based on available information using one of several models endorsed by the [place] (ACS). The ACS recommends consideration of breast MRI screening as an adjunct to mammography for patients at high risk (defined as 20% or greater lifetime risk). A more detailed breast cancer risk assessment can be considered, if clinically indicated.   Allison Craig  has been determined to be at high risk for breast cancer.  Therefore, we recommend that annual screening with mammography and breast MRI begin at age 78, or 10 years prior to the age of breast cancer diagnosis in a relative (whichever is earlier).  We discussed that Allison Craig should discuss her individual situation with her referring physician and determine a breast cancer screening plan with which they are both comfortable.    PLAN: After considering the risks, benefits, and limitations, Allison Craig  provided informed consent to pursue genetic testing and the blood sample was sent to [facility] for analysis of the [facility] panel. Results should be available within approximately 2-3 weeks' time, at which point they will be disclosed by telephone to Allison Craig, as will any additional recommendations warranted by these results. Allison Craig will receive a summary of her genetic counseling visit and a copy of her results once available. This information will also be available in Epic. We  encouraged Allison Craig to remain in contact with cancer genetics annually so that we can continuously update the family history and inform her of any changes in cancer genetics and testing that may be of benefit for her family. Allison Craig questions were answered to her satisfaction today. Our contact information was provided should additional questions or concerns arise.  Based on Allison Craig's family history, we recommended her mother, who was diagnosed with breast cancer at age 46, have genetic counseling and testing. Allison Craig will let us know if we can be of any assistance in coordinating genetic counseling and/or testing for this family member.   Lastly, we encouraged Allison Craig to remain in contact with cancer genetics annually so that we can continuously update the family history and inform her of any changes in cancer genetics and testing that may be of benefit for this family.   Ms.  Craig questions were answered to her satisfaction today. Our contact information was provided should additional questions or concerns arise. Thank you for the referral and allowing [place] to share in the care of your patient.   Karen P. Florene Glen, [place], [facility] Certified Genetic Counselor [email or web address] phone: [phone]  The patient was seen for a total of 90 minutes in face-to-face genetic counseling.  This patient was discussed with Drs. Magrinat, Lindi Adie and/or Burr Medico who agrees with the above.    _______________________________________________________________________ For Office Staff:  Number of people involved in session: 1 Was an Intern/ student involved with case: no

## 2016-08-20 ENCOUNTER — Encounter: Payer: Self-pay | Admitting: Genetic Counselor

## 2016-08-20 DIAGNOSIS — Z1379 Encounter for other screening for genetic and chromosomal anomalies: Secondary | ICD-10-CM | POA: Insufficient documentation

## 2016-08-23 ENCOUNTER — Other Ambulatory Visit (INDEPENDENT_AMBULATORY_CARE_PROVIDER_SITE_OTHER): Payer: BLUE CROSS/BLUE SHIELD

## 2016-08-23 DIAGNOSIS — E890 Postprocedural hypothyroidism: Secondary | ICD-10-CM | POA: Diagnosis not present

## 2016-08-23 LAB — TSH: TSH: 0.58 u[IU]/mL (ref 0.35–4.50)

## 2016-08-23 LAB — T4, FREE: Free T4: 1.13 ng/dL (ref 0.60–1.60)

## 2016-08-27 ENCOUNTER — Telehealth: Payer: Self-pay | Admitting: Genetic Counselor

## 2016-08-27 NOTE — Telephone Encounter (Signed)
Called but could not leave a message. Her mailbox was full.

## 2016-09-01 ENCOUNTER — Telehealth: Payer: Self-pay | Admitting: Genetic Counselor

## 2016-09-01 ENCOUNTER — Encounter: Payer: Self-pay | Admitting: Genetic Counselor

## 2016-09-01 NOTE — Telephone Encounter (Signed)
Mailbox is still full

## 2016-09-14 ENCOUNTER — Telehealth: Payer: Self-pay | Admitting: Genetic Counselor

## 2016-09-14 NOTE — Telephone Encounter (Signed)
Patient Allison Craig on my VM stating that she received my letter and was calling to get her results.  She asked that I call but that her cell phone did not work at her office and she would CB if she was that I called.  I called and her VM was full.  I could not leave a message.

## 2016-09-17 ENCOUNTER — Ambulatory Visit: Payer: Self-pay | Admitting: Genetic Counselor

## 2016-09-17 ENCOUNTER — Telehealth: Payer: Self-pay | Admitting: Genetic Counselor

## 2016-09-17 DIAGNOSIS — Z1379 Encounter for other screening for genetic and chromosomal anomalies: Secondary | ICD-10-CM

## 2016-09-17 DIAGNOSIS — Z800 Family history of malignant neoplasm of digestive organs: Secondary | ICD-10-CM

## 2016-09-17 DIAGNOSIS — Z803 Family history of malignant neoplasm of breast: Secondary | ICD-10-CM

## 2016-09-17 NOTE — Progress Notes (Signed)
HPI: [name] was previously seen in the [facility] clinic due to a family history of cancer and concerns regarding a hereditary predisposition to cancer. Please refer to our prior cancer genetics clinic note for more information regarding [name]'s medical, social and family histories, and our assessment and recommendations, at the time. [name] recent genetic test results were disclosed to her, as were recommendations warranted by these results. These results and recommendations are discussed in more detail below.  CANCER HISTORY:   No history exists.    FAMILY HISTORY:  We obtained a detailed, 4-generation family history.  Significant diagnoses are listed below: Family History  Problem Relation Age of Onset  . Breast cancer Mother 70  . Thyroid cancer Mother 31    mix of papillary and follicular  . Lung cancer Maternal Uncle     smoker  . Heart failure Maternal Grandmother   . Heart disease Maternal Grandfather   . Stroke Maternal Grandfather   . Breast cancer Maternal Aunt 60  . Breast cancer Maternal Aunt 68  . Breast cancer Maternal Aunt 65    BRCA neg  . Colon cancer Maternal Uncle   . Testicular cancer Cousin 88    The patient has two daughters and a son who are cancer free.  She is an only child.  She does not have any information about her father or his family.  Her mother was diagnosed with breast cancer at age 24 and papillary/follicular thyroid cancer at age 34.  Her mother has four sisters and six brothers.  Three sisters developed breast cancer in their 22's, a brother developed colon cancer in his 52's and another brother died of lung cancer.  One maternal cousin was diagnosed with testicular cancer at 10.  There is no other reported cancer in the family.  [name] is aware of previous genetic testing for hereditary cancer risks performed in her maternal aunt.  It was reportedly negative and performed around 2014. Patient's maternal ancestors  are of [place] and [place] descent, and paternal ancestors are of unknown Caucasian descent. There is no reported Ashkenazi Jewish ancestry. There is no known consanguinity.  GENETIC TEST RESULTS: Genetic testing reported out on August 17, 2016 through the [facility] cancer panel found no deleterious mutations.  The Breast/GYN gene panel offered by [facility] includes sequencing and rearrangement analysis for the following 23 genes:  ATM, BARD1, BRCA1, BRCA2, BRIP1, CDH1, CHEK2, EPCAM, FANCC, MLH1, MSH2, MSH6, MUTYH, NBN, NF1, PALB2, PMS2, POLD1, PTEN, RAD51C, RAD51D, RECQL, and TP53.   The test report has been scanned into EPIC and is located under the Molecular Pathology section of the Results Review tab.   We discussed with [name] that since the current genetic testing is not perfect, it is possible there may be a gene mutation in one of these genes that current testing cannot detect, but that chance is small. We also discussed, that it is possible that another gene that has not yet been discovered, or that we have not yet tested, is responsible for the cancer diagnoses in the family, and it is, therefore, important to remain in touch with cancer genetics in the future so that we can continue to offer [name] the most up to date genetic testing.   CANCER SCREENING RECOMMENDATIONS:   Given [name]'s personal and family histories, we must interpret these negative results with some caution.  Families with features suggestive of hereditary risk for cancer tend to have multiple family members  with cancer, diagnoses in multiple generations and diagnoses before the age of 58. [name] family exhibits some of these features. Thus this result may simply reflect our current inability to detect all mutations within these genes or there may be a different gene that has not yet been discovered or tested.   Based on the [name]'s personal and family history of cancer, as well as her genetic test results,  statistical models (Tyrer Cusik)  and literature data were used to estimate her risk of developing breast cancer. This estimates her lifetime risk of developing breast cancer to be approximately 27.1%.  The patient's lifetime breast cancer risk is a preliminary estimate based on available information using one of several models endorsed by the [place] (ACS). The ACS recommends consideration of breast MRI screening as an adjunct to mammography for patients at high risk (defined as 20% or greater lifetime risk). A more detailed breast cancer risk assessment can be considered, if clinically indicated.   [name] has been determined to be at high risk for breast cancer.  Therefore, we recommend that annual screening with mammography and breast MRI begin at age 64, or 10 years prior to the age of breast cancer diagnosis in a relative (whichever is earlier).  We discussed that [name] should discuss her individual situation with her referring physician and determine a breast cancer screening plan with which they are both comfortable.    RECOMMENDATIONS FOR FAMILY MEMBERS: Women in this family might be at some increased risk of developing cancer, over the general population risk, simply due to the family history of cancer. We recommended women in this family have a yearly mammogram beginning at age 31, or 51 years younger than the earliest onset of cancer, an annual clinical breast exam, and perform monthly breast self-exams. Women in this family should also have a gynecological exam as recommended by their primary provider. All family members should have a colonoscopy by age 61.  Based on [name]'s family history, we recommended her mother, who was diagnosed with breast cancer at age 37, have genetic counseling and testing. [name] will let us know if we can be of any assistance in coordinating genetic counseling and/or testing for this family member.   FOLLOW-UP: Lastly, we discussed  with [name] that cancer genetics is a rapidly advancing field and it is possible that new genetic tests will be appropriate for her and/or her family members in the future. We encouraged her to remain in contact with cancer genetics on an annual basis so we can update her personal and family histories and let her know of advances in cancer genetics that may benefit this family.   Our contact number was provided. [name] questions were answered to her satisfaction, and she knows she is welcome to call us at anytime with additional questions or concerns.   Allison Kayser, MS, [facility] Certified Genetic Counselor [email or web address]

## 2016-09-17 NOTE — Telephone Encounter (Signed)
Revealed negative genetic testing.  Discussed that we do not know why there is cancer in the family. It could be due to a different gene that we are not testing, or maybe our current technology may not be able to pick something up.  It will be important for her to keep in contact with genetics to keep up with whether additional testing may be needed.  We recommend that the patient's mother consider genetic testing.

## 2016-10-06 ENCOUNTER — Other Ambulatory Visit: Payer: Self-pay | Admitting: Internal Medicine

## 2016-11-03 ENCOUNTER — Encounter (HOSPITAL_COMMUNITY): Payer: Self-pay

## 2016-11-22 ENCOUNTER — Other Ambulatory Visit: Payer: Self-pay | Admitting: Internal Medicine

## 2016-11-22 NOTE — Telephone Encounter (Signed)
This patient needs to come in for a OV. Do you want me to approve the refill? Or does she need an appointment first? Please advise.  Thank you!  Jinny Blossom

## 2016-11-24 ENCOUNTER — Telehealth: Payer: Self-pay | Admitting: Internal Medicine

## 2016-11-24 ENCOUNTER — Other Ambulatory Visit: Payer: Self-pay

## 2016-11-24 MED ORDER — SYNTHROID 137 MCG PO TABS: ORAL_TABLET | ORAL | 0 refills | Status: DC

## 2016-11-24 NOTE — Telephone Encounter (Signed)
Pt is questioning if she is needing bloodwork, she only has one pill left of her Synthroid, can you please call her when you can to let her know what she needs to do. Thanks!

## 2016-11-24 NOTE — Telephone Encounter (Signed)
Does patient need labs? If so which labs? Please advise. Thank you!

## 2016-11-24 NOTE — Telephone Encounter (Signed)
Let's refill her dose and have her back for an appt in July-August and I will recheck her TFTs then.

## 2016-11-25 ENCOUNTER — Other Ambulatory Visit: Payer: Self-pay

## 2016-11-25 MED ORDER — SYNTHROID 137 MCG PO TABS: ORAL_TABLET | ORAL | 0 refills | Status: DC

## 2016-11-25 NOTE — Telephone Encounter (Signed)
Called and notified patient of note, patient scheduled appointment.

## 2016-11-25 NOTE — Telephone Encounter (Signed)
Called and notified patient of Dr.Gherghe's note. Patient understood and made appointment.  Will refill until appointment date.

## 2016-12-23 ENCOUNTER — Other Ambulatory Visit: Payer: Self-pay | Admitting: Internal Medicine

## 2016-12-24 ENCOUNTER — Other Ambulatory Visit: Payer: Self-pay

## 2016-12-24 MED ORDER — SYNTHROID 137 MCG PO TABS: ORAL_TABLET | ORAL | 0 refills | Status: DC

## 2017-01-20 ENCOUNTER — Encounter: Payer: Self-pay | Admitting: Internal Medicine

## 2017-01-21 ENCOUNTER — Other Ambulatory Visit: Payer: Self-pay

## 2017-01-21 MED ORDER — SYNTHROID 137 MCG PO TABS: ORAL_TABLET | ORAL | 0 refills | Status: DC

## 2017-03-14 ENCOUNTER — Encounter: Payer: Self-pay | Admitting: Internal Medicine

## 2017-03-14 ENCOUNTER — Ambulatory Visit (INDEPENDENT_AMBULATORY_CARE_PROVIDER_SITE_OTHER): Payer: BLUE CROSS/BLUE SHIELD | Admitting: Internal Medicine

## 2017-03-14 VITALS — BP 110/72 | HR 75 | Wt 153.0 lb

## 2017-03-14 DIAGNOSIS — E890 Postprocedural hypothyroidism: Secondary | ICD-10-CM

## 2017-03-14 DIAGNOSIS — R5383 Other fatigue: Secondary | ICD-10-CM

## 2017-03-14 LAB — T4, FREE: FREE T4: 1.18 ng/dL (ref 0.60–1.60)

## 2017-03-14 LAB — TSH: TSH: 0.65 u[IU]/mL (ref 0.35–4.50)

## 2017-03-14 MED ORDER — SYNTHROID 137 MCG PO TABS: ORAL_TABLET | ORAL | 3 refills | Status: DC

## 2017-03-14 NOTE — Progress Notes (Signed)
Patient ID: Allison Craig, female   DOB: [birth year], 46 y.o.   MRN: [id number]   HPI  Allison Craig is a 46 y.o.-year-old female, returning for follow-up for postsurgical hypothyroidism. Last visit was 6.5 months ago.   Since last visit, patient started to exercise more consistently and felt great, except in the last month, when she started to have more fatigue.   Pt. has a history of multinodular goiter, s/p thyroidectomy in 2013.   Reviewed pertinent tests:  The cytology of one of the nodules was read as indeterminate (Bethesda class III): THYROID, FINE NEEDLE ASPIRATION, RIGHT: FINDINGS CONSISTENT WITH A FOLLICULAR NEOPLASM AND/OR LESION. SEE COMMENT. 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  However, the final pathology was benign: 1. Thyroid, lobectomy, Left - BENIGN DOMINANT HYPERPLASTIC NODULE (5 CM) WITH HURTHLE CELL CHANGES IN AN ASSOCIATED BACKGROUND OF LYMPHOCYTIC THYROIDITIS. - NO EVIDENCE OF MALIGNANCY. 2. Thyroid, lobectomy, Right - MULTINODULAR GOITER WITH ASSOCIATED LYMPHOCYTIC THYROIDITIS. - ONE BENIGN PARATHYROID IDENTIFIED. - NO EVIDENCE OF MALIGNANCY.  For her postsurgical hypothyroidism, she was initially on Synthroid 112 mcg, however, she desired to switch to Armour (skin itching), and this was started at 120 mg (the equivalent of ~200 mcg of levothyroxine!), then 90 mg, then alternating 90 and 60 mg. Subsequent tests were normal, but she felt "unstable". I then suggested to switch to Synthroid DAW >> continues on this.  Pt is on Synthroid 137 mcg daily (dose increased 07/2016), taken: - in am - fasting - at least 30 min from b'fast - no Ca, Fe, PPIs - no MVI lately, but taking it in the pm - not on Biotin  I reviewed pt's thyroid tests: Lab Results  Component Value Date   TSH 0.58 08/23/2016   Lab Results  Component Value Date   FREET4 1.13 08/23/2016   FREET4 0.84  05/10/2016   FREET4 1.30 09/04/2015   FREET4 1.45 06/09/2015   FREET4 1.36 01/27/2015  05/09/2014: TSH 0.011, free T4 1.10  - checked by Dr. Runell Gess  Pt denies: - feeling nodules in neck - hoarseness - dysphagia - choking - SOB with lying down  She has + FH of thyroid cancer in mother >> in 2002, doing well.  Switched from Lexapro >> Lamictal >> caused metrorrhagia >> stopped.  ROS: Constitutional: n+ weight gain/no weight loss, + fatigue, no subjective hyperthermia, no subjective hypothermia Eyes: no blurry vision, no xerophthalmia ENT: no sore throat, no nodules palpated in throat, no dysphagia, no odynophagia, no hoarseness Cardiovascular: no CP/no SOB/no palpitations/no leg swelling Respiratory: no cough/no SOB/no wheezing Gastrointestinal: no N/no V/no D/no C/no acid reflux Musculoskeletal: no muscle aches/no joint aches Skin: no rashes, no more hair loss Neurological: no tremors/no numbness/no tingling/no dizziness  I reviewed pt's medications, allergies, PMH, social hx, family hx, and changes were documented in the history of present illness. Otherwise, unchanged from my initial visit note. Started Neurontin. Stopped Minastrin  Past Medical History  Diagnosis Date  . Multiple thyroid nodules    Past Surgical History:  Procedure Laterality Date  . CESAREAN SECTION     times  2  . KNEE ARTHROSCOPY     left knee  . THYROIDECTOMY  03/10/2012   Procedure: THYROIDECTOMY;  Surgeon: Melissa Montane, MD;  Location: [place];  Service: ENT;  Laterality: Bilateral;  Total Bilateral Thyroidectomy   History   Social History  . Marital Status: Married    Spouse Name: N/A  Number of Children: 3    She is a Sport and exercise psychologist.  Social History Main Topics  . Smoking status: Never Smoker   . Smokeless tobacco: Not on file  . Alcohol Use: Yes     Comment: occasionally  . Drug Use: No   Current Outpatient Prescriptions  Medication Sig Dispense Refill  .  amphetamine-dextroamphetamine (ADDERALL XR) 20 MG 24 hr capsule TK 1 C PO QD  0  . fexofenadine (ALLEGRA) 180 MG tablet Take 180 mg by mouth daily.    Marland Kitchen gabapentin (NEURONTIN) 300 MG capsule     . ibuprofen (ADVIL,MOTRIN) 200 MG tablet Take 400 mg by mouth every 6 (six) hours as needed. For pain    . MINASTRIN 24 FE 1-20 MG-MCG(24) CHEW Reported on 01/08/2016    . montelukast (SINGULAIR) 10 MG tablet     . Multiple Vitamins-Minerals (CENTRUM PO) Take 1 capsule by mouth daily. For Women    . norgestimate-ethinyl estradiol (MONONESSA) 0.25-35 MG-MCG tablet Take 1 tablet by mouth daily.    Marland Kitchen PROAIR HFA 108 (90 BASE) MCG/ACT inhaler     . QVAR 40 MCG/ACT inhaler     . SYNTHROID 137 MCG tablet TAKE 1 TABLET(137 MCG) BY MOUTH DAILY BEFORE BREAKFAST 90 tablet 0   No current facility-administered medications for this visit.     Allergies  Allergen Reactions  . Adhesive [Tape]     Patient states that tape used for prior surgeries caused redness of the skin.  Marland Kitchen Penicillins     Respiratory problems (cough)  . Sulfa Antibiotics Rash   FH: Per HPI  PE: BP 110/72 (BP Location: Left Arm, Patient Position: Sitting)   Pulse 75   Wt 153 lb (69.4 kg)   LMP 02/28/2017   SpO2 97%   BMI 24.32 kg/m  Body mass index is 24.32 kg/m. Wt Readings from Last 3 Encounters:  03/14/17 153 lb (69.4 kg)  01/08/16 155 lb (70.3 kg)  06/09/15 148 lb 12.8 oz (67.5 kg)   Constitutional: overweight, in NAD Eyes: PERRLA, EOMI, no exophthalmos ENT: moist mucous membranes, cervical scar healed no cervical lymphadenopathy Cardiovascular: RRR, No MRG Respiratory: CTA B Gastrointestinal: abdomen soft, NT, ND, BS+ Musculoskeletal: no deformities, strength intact in all 4 Skin: moist, warm, no rashes Neurological: no tremor with outstretched hands, DTR normal in all 4  ASSESSMENT: 1. Hypothyroidism - postsurgical  2. Fatigue  PLAN:  1. Patient with history of hypothyroidism, previously on Armour therapy, now  on Synthroid d.a.w. - latest thyroid labs reviewed with pt >> finally normal  - she continues on LT4 137 mcg daily - pt feels good on this dose. - we discussed about taking the thyroid hormone every day, with water, >30 minutes before breakfast, separated by >4 hours from acid reflux medications, calcium, iron, multivitamins. Pt. is taking it correctly - will check thyroid tests today: TSH and fT4 - If labs are abnormal, she will need to return for repeat TFTs in 1.5 months - OTW, RTC in 1 year   2. Fatigue - accentuated in the last month  We discussed about possible causes for this:  + She is using her steroid inhaler more due to allergies  + She is also on Neurontin 300 mg twice a day for anxiety  >> Discussed to try to reduce this  + she stopped her MVI >> will restart  Needs refills.  Office Visit on 03/14/2017  Component Date Value Ref Range Status  . TSH 03/14/2017 0.65  0.35 -  4.50 uIU/mL Final  . Free T4 03/14/2017 1.18  0.60 - 1.60 ng/dL Final   Comment: Specimens from patients who are undergoing biotin therapy and /or ingesting biotin supplements may contain high levels of biotin.  The higher biotin concentration in these specimens interferes with this Free T4 assay.  Specimens that contain high levels  of biotin may cause false high results for this Free T4 assay.  Please interpret results in light of the total clinical presentation of the patient.     Labs are great!  [name], MD PhD [facility] Endocrin[facility]

## 2017-03-14 NOTE — Patient Instructions (Signed)
Please continue Synthroid 137 mcg daily.  Take the thyroid hormone every day, with water, at least 30 minutes before breakfast, separated by at least 4 hours from: - acid reflux medications - calcium - iron - multivitamins  Please come back in 1 year.

## 2018-03-09 ENCOUNTER — Other Ambulatory Visit: Payer: Self-pay | Admitting: Internal Medicine

## 2018-03-20 ENCOUNTER — Ambulatory Visit: Payer: BLUE CROSS/BLUE SHIELD | Admitting: Internal Medicine

## 2018-03-20 ENCOUNTER — Encounter: Payer: Self-pay | Admitting: Internal Medicine

## 2018-03-20 VITALS — BP 110/80 | HR 83 | Ht 68.0 in | Wt 156.8 lb

## 2018-03-20 DIAGNOSIS — L659 Nonscarring hair loss, unspecified: Secondary | ICD-10-CM

## 2018-03-20 DIAGNOSIS — R5382 Chronic fatigue, unspecified: Secondary | ICD-10-CM | POA: Diagnosis not present

## 2018-03-20 DIAGNOSIS — E890 Postprocedural hypothyroidism: Secondary | ICD-10-CM

## 2018-03-20 DIAGNOSIS — R5383 Other fatigue: Secondary | ICD-10-CM | POA: Insufficient documentation

## 2018-03-20 LAB — T4, FREE: FREE T4: 1.01 ng/dL (ref 0.60–1.60)

## 2018-03-20 LAB — TSH: TSH: 1.18 u[IU]/mL (ref 0.35–4.50)

## 2018-03-20 NOTE — Progress Notes (Signed)
Patient ID: Allison Craig, female   DOB: [birth year], 47 y.o.   MRN: [id number]   HPI  Allison Craig is a 47 y.o.-year-old female, returning for follow-up for postsurgical hypothyroidism. Last visit was a year ago.  She will have an endometrial ablation in September - having increased menstrual bleeding/fibroids - Dr Runell Gess.   She also has mores stress in her life - mother has CA, she defended her thesis this year, looking for a job.   She has a history of multinodular goiter, and is status post thyroidectomy in 2013.  Reviewed pertinent investigation: The cytology of one of the nodules was read as indeterminate (Bethesda class III): THYROID, FINE NEEDLE ASPIRATION, RIGHT: FINDINGS CONSISTENT WITH A FOLLICULAR NEOPLASM AND/OR LESION. SEE COMMENT. 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  However, the final pathology was benign: 1. Thyroid, lobectomy, Left - BENIGN DOMINANT HYPERPLASTIC NODULE (5 CM) WITH HURTHLE CELL CHANGES IN AN ASSOCIATED BACKGROUND OF LYMPHOCYTIC THYROIDITIS. - NO EVIDENCE OF MALIGNANCY. 2. Thyroid, lobectomy, Right - MULTINODULAR GOITER WITH ASSOCIATED LYMPHOCYTIC THYROIDITIS. - ONE BENIGN PARATHYROID IDENTIFIED. - NO EVIDENCE OF MALIGNANCY.  For her postsurgical hypothyroidism, she was initially on Synthroid 112 mcg, however, she desired to switch to Armour (skin itching), and this was started at 120 mg (the equivalent of ~200 mcg of levothyroxine!), then 90 mg, then alternating 90 and 60 mg every other day. Subsequent tests were normal, but she felt "unstable".  I then suggested to switch to Synthroid d.a.w. and she feels well on this.  Pt is on Synthroid d.a.w. 137 Mcg daily, taken: - in am - fasting - at least 30 min from b'fast - no Ca, Fe, MVI, PPIs - not on Biotin  Reviewed patient's TFTs: 09/2017: TSH reportedly normal Lab Results  Component Value Date   TSH 0.65  03/14/2017   Lab Results  Component Value Date   FREET4 1.18 03/14/2017   FREET4 1.13 08/23/2016   FREET4 0.84 05/10/2016   FREET4 1.30 09/04/2015   FREET4 1.45 06/09/2015  05/09/2014: TSH 0.011, free T4 1.10  - checked by Dr. Runell Gess  She c/o not losing weight, also continues to have a menstrual cycle (fibroid).  Pt denies: - feeling nodules in neck - hoarseness - dysphagia - choking - SOB with lying down  She has + family history of thyroid cancer in mother >> in 2002, doing well.  She was previously on Lexapro >> changed to Lamictal >> metrorrhagia >> stopped.  She has a 93 y/o daughter and 1 set of 20 y/o twins.  ROS: Constitutional: + weight gain/no weight loss, + fatigue, no subjective hyperthermia, no subjective hypothermia Eyes: no blurry vision, no xerophthalmia ENT: no sore throat, + see HPI Cardiovascular: no CP/no SOB/no palpitations/no leg swelling Respiratory: no cough/no SOB/no wheezing Gastrointestinal: no N/no V/no D/no C/no acid reflux Musculoskeletal: no muscle aches/+ joint aches Skin: no rashes, + hair loss Neurological: no tremors/no numbness/no tingling/no dizziness  I reviewed pt's medications, allergies, PMH, social hx, family hx, and changes were documented in the history of present illness. Otherwise, unchanged from my initial visit note.  Past Medical History  Diagnosis Date  . Multiple thyroid nodules    Past Surgical History:  Procedure Laterality Date  . CESAREAN SECTION     times  2  . KNEE ARTHROSCOPY     left knee  . THYROIDECTOMY  03/10/2012   Procedure: THYROIDECTOMY;  Surgeon: Melissa Montane, MD;  Location: [facility];  Service: ENT;  Laterality: Bilateral;  Total Bilateral Thyroidectomy   History   Social History  . Marital Status: Married    Spouse Name: N/A    Number of Children: 3    She is a Sport and exercise psychologist.  Social History Main Topics  . Smoking status: Never Smoker   . Smokeless tobacco: Not on file  . Alcohol Use: Yes      Comment: occasionally  . Drug Use: No   Current Outpatient Medications  Medication Sig Dispense Refill  . amphetamine-dextroamphetamine (ADDERALL XR) 20 MG 24 hr capsule TK 1 C PO QD  0  . fexofenadine (ALLEGRA) 180 MG tablet Take 180 mg by mouth daily.    Marland Kitchen gabapentin (NEURONTIN) 300 MG capsule     . ibuprofen (ADVIL,MOTRIN) 200 MG tablet Take 400 mg by mouth every 6 (six) hours as needed. For pain    . MINASTRIN 24 FE 1-20 MG-MCG(24) CHEW Reported on 01/08/2016    . montelukast (SINGULAIR) 10 MG tablet     . Multiple Vitamins-Minerals (CENTRUM PO) Take 1 capsule by mouth daily. For Women    . norgestimate-ethinyl estradiol (MONONESSA) 0.25-35 MG-MCG tablet Take 1 tablet by mouth daily.    Marland Kitchen PROAIR HFA 108 (90 BASE) MCG/ACT inhaler     . QVAR 40 MCG/ACT inhaler     . SYNTHROID 137 MCG tablet TAKE 1 TABLET(137 MCG) BY MOUTH DAILY BEFORE BREAKFAST 90 tablet 0   No current facility-administered medications for this visit.     Allergies  Allergen Reactions  . Adhesive [Tape]     Patient states that tape used for prior surgeries caused redness of the skin.  Marland Kitchen Penicillins     Respiratory problems (cough)  . Sulfa Antibiotics Rash   FH: Per HPI  PE: BP 110/80   Pulse 83   Ht 5\' 8"  (1.727 m)   Wt 156 lb 12.8 oz (71.1 kg)   SpO2 96%   BMI 23.84 kg/m  Body mass index is 23.84 kg/m. Wt Readings from Last 3 Encounters:  03/20/18 156 lb 12.8 oz (71.1 kg)  03/14/17 153 lb (69.4 kg)  01/08/16 155 lb (70.3 kg)   Constitutional: Normal weight, in NAD Eyes: PERRLA, EOMI, no exophthalmos ENT: moist mucous membranes, cervical scar healed, no cervical lymphadenopathy Cardiovascular: RRR, No MRG Respiratory: CTA B Gastrointestinal: abdomen soft, NT, ND, BS+ Musculoskeletal: no deformities, strength intact in all 4 Skin: moist, warm, no rashes Neurological: no tremor with outstretched hands, DTR normal in all 4  ASSESSMENT: 1. Hypothyroidism - postsurgical  2. Fatigue  3.  Hair loss  PLAN:  1. Patient with history of hypothyroidism, previously on Armour Thyroid, now on Synthroid d.a.w. - latest thyroid labs reviewed with pt >> normal a year ago - she continues on LT4 137 Mcg daily - pt feels good on this dose. - we discussed about taking the thyroid hormone every day, with water, >30 minutes before breakfast, separated by >4 hours from acid reflux medications, calcium, iron, multivitamins. Pt. is taking it correctly. - will check thyroid tests today: TSH and fT4 - If labs are abnormal, she will need to return for repeat TFTs in 1.5 months - Otherwise, we will see her back in a year  2. Fatigue - Persistent, stressed - having prolonged cycles >> fatigue may improve after her endom. ablation - she continues MVI, which help  3. Hair loss - hair falls in clumps - rec'd a B complex >> discussed she needs  to stop this 5 days before future TFTs to avoid Biotin interference   Office Visit on 03/20/2018  Component Date Value Ref Range Status  . TSH 03/20/2018 1.18  0.35 - 4.50 uIU/mL Final  . Free T4 03/20/2018 1.01  0.60 - 1.60 ng/dL Final   Comment: Specimens from patients who are undergoing biotin therapy and /or ingesting biotin supplements may contain high levels of biotin.  The higher biotin concentration in these specimens interferes with this Free T4 assay.  Specimens that contain high levels  of biotin may cause false high results for this Free T4 assay.  Please interpret results in light of the total clinical presentation of the patient.     Thyroid tests are excellent. [name], MD PhD [facility] Endocrin[facility]

## 2018-03-20 NOTE — Patient Instructions (Addendum)
Please stop at the lab.  Start a B complex daily - but do not forget to stop this 5 days before thyroid labs in the future.  Please continue Synthroid 137 mcg daily.  Take the thyroid hormone every day, with water, at least 30 minutes before breakfast, separated by at least 4 hours from: - acid reflux medications - calcium - iron - multivitamins  Please come back in 1 year.

## 2018-06-03 ENCOUNTER — Other Ambulatory Visit: Payer: Self-pay | Admitting: Internal Medicine

## 2018-08-03 ENCOUNTER — Encounter: Payer: Self-pay | Admitting: Internal Medicine

## 2018-08-03 ENCOUNTER — Other Ambulatory Visit: Payer: Self-pay | Admitting: Internal Medicine

## 2018-08-03 DIAGNOSIS — E890 Postprocedural hypothyroidism: Secondary | ICD-10-CM

## 2018-08-30 ENCOUNTER — Other Ambulatory Visit: Payer: Self-pay | Admitting: Internal Medicine

## 2018-09-08 ENCOUNTER — Other Ambulatory Visit (INDEPENDENT_AMBULATORY_CARE_PROVIDER_SITE_OTHER): Payer: BLUE CROSS/BLUE SHIELD

## 2018-09-08 ENCOUNTER — Other Ambulatory Visit: Payer: Self-pay | Admitting: Internal Medicine

## 2018-09-08 DIAGNOSIS — E890 Postprocedural hypothyroidism: Secondary | ICD-10-CM | POA: Diagnosis not present

## 2018-09-08 LAB — TSH: TSH: 0.31 u[IU]/mL — ABNORMAL LOW (ref 0.35–4.50)

## 2018-09-08 LAB — T4, FREE: Free T4: 1.19 ng/dL (ref 0.60–1.60)

## 2018-09-08 MED ORDER — SYNTHROID 125 MCG PO TABS: 125.0000 ug | ORAL_TABLET | Freq: Every day | ORAL | 5 refills | Status: DC

## 2018-10-30 ENCOUNTER — Encounter: Payer: Self-pay | Admitting: Internal Medicine

## 2019-02-16 ENCOUNTER — Other Ambulatory Visit: Payer: Self-pay | Admitting: Internal Medicine

## 2019-02-16 MED ORDER — SYNTHROID 112 MCG PO TABS: 112.0000 ug | ORAL_TABLET | Freq: Every day | ORAL | 3 refills | Status: DC

## 2019-02-25 ENCOUNTER — Encounter: Payer: Self-pay | Admitting: Internal Medicine

## 2019-03-09 ENCOUNTER — Other Ambulatory Visit: Payer: Self-pay

## 2019-03-09 MED ORDER — SYNTHROID 112 MCG PO TABS: 112.0000 ug | ORAL_TABLET | Freq: Every day | ORAL | 0 refills | Status: DC

## 2019-03-09 NOTE — Telephone Encounter (Signed)
Please review and advise.

## 2019-03-12 ENCOUNTER — Telehealth: Payer: Self-pay | Admitting: Internal Medicine

## 2019-03-12 NOTE — Telephone Encounter (Signed)
LVM to call back to walk through how to do a virtual  ----- Message -----  From: Sinda Du  Sent: 03/09/2019  5:08 PM EDT  To: [facility] Clinical Pool  Subject: RE: Non-Urgent Medical Question           Thank you, Olen Cordial. I look forward to feeling better. As for the appointment, that was made prior to the pandemic and our move. Can I keep that appointment as a telehealth appointment? If so, what is your office protocol for virtual appointments?    Thank you for all your help!    Best,  Allison Craig

## 2019-03-14 NOTE — Telephone Encounter (Signed)
Appt changed to Doxy per front desk

## 2019-03-22 ENCOUNTER — Other Ambulatory Visit: Payer: Self-pay

## 2019-03-22 ENCOUNTER — Ambulatory Visit (INDEPENDENT_AMBULATORY_CARE_PROVIDER_SITE_OTHER): Payer: BC Managed Care – PPO | Admitting: Internal Medicine

## 2019-03-22 ENCOUNTER — Encounter: Payer: Self-pay | Admitting: Internal Medicine

## 2019-03-22 DIAGNOSIS — L659 Nonscarring hair loss, unspecified: Secondary | ICD-10-CM | POA: Diagnosis not present

## 2019-03-22 DIAGNOSIS — R5382 Chronic fatigue, unspecified: Secondary | ICD-10-CM

## 2019-03-22 DIAGNOSIS — E890 Postprocedural hypothyroidism: Secondary | ICD-10-CM

## 2019-03-22 NOTE — Progress Notes (Signed)
Patient ID: Allison Craig, female   DOB: 1971/07/02, 48 y.o.   MRN: [id number]  Patient location: Home My location: Office  Referring Provider: Kelton Pillar, MD  I connected with the patient on 03/22/19 at  9:08 AM EDT by a video enabled telemedicine application and verified that I am speaking with the correct person.   I discussed the limitations of evaluation and management by telemedicine and the availability of in person appointments. The patient expressed understanding and agreed to proceed.   Details of the encounter are shown below.  HPI  Allison Craig is a 48 y.o.-year-old female, returning for follow-up for postsurgical hypothyroidism. Last visit was a year ago.  At last visit, she had more stress in her life- mother with cancer, she defended her thesis, looking for a job.  Since then, she moved to [place].  She has a history of multinodular goiter, status post thyroidectomy in 2013.  Reviewed pertinent investigation: The cytology of one of the nodules was read as indeterminate (Bethesda class III): THYROID, FINE NEEDLE ASPIRATION, RIGHT: FINDINGS CONSISTENT WITH A FOLLICULAR NEOPLASM AND/OR LESION. SEE COMMENT. 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  However, the final pathology was benign: 1. Thyroid, lobectomy, Left - BENIGN DOMINANT HYPERPLASTIC NODULE (5 CM) WITH HURTHLE CELL CHANGES IN AN ASSOCIATED BACKGROUND OF LYMPHOCYTIC THYROIDITIS. - NO EVIDENCE OF MALIGNANCY. 2. Thyroid, lobectomy, Right - MULTINODULAR GOITER WITH ASSOCIATED LYMPHOCYTIC THYROIDITIS. - ONE BENIGN PARATHYROID IDENTIFIED. - NO EVIDENCE OF MALIGNANCY.  For her postsurgical hypothyroidism, she was initially on Synthroid 112 mcg daily, however, she desires to switch to Armour thyroid (due to skin itching).  The doses of Armour were decreased afterwards but she did not feel stable on the medication and she  switched back to Synthroid d.a.w.  We had to decrease the dose since last visit due to suppressed TSH levels.  Pt is currently on Synthroid d.a.w. 112 Mcg daily (dose decreased 03/14/2019), taken: - in am - fasting - at least 30 min from b'fast - no Ca, Fe, MVI, PPIs - not on Biotin  Reviewed patient's TFTs: 02/15/2019: TSH 0.076 Lab Results  Component Value Date   TSH 0.31 (L) 09/08/2018   Lab Results  Component Value Date   FREET4 1.19 09/08/2018   FREET4 1.01 03/20/2018   FREET4 1.18 03/14/2017   FREET4 1.13 08/23/2016   FREET4 0.84 05/10/2016  05/09/2014: TSH 0.011, free T4 1.10  - checked by Dr. Runell Gess  At last visit she was having menometrorrhagia and was planning to have endometrial ablation.  She had this in 04/2018.  Pt denies: - feeling nodules in neck - hoarseness - dysphagia - choking - SOB with lying down  She has positive family history of thyroid cancer in mother in 2002, doing well.  She was previously on Lexapro >> changed to Lamictal >> metrorrhagia >> stopped.  She has a 24 y/o daughter and 1 set of 1 y/o twins.  ROS: Constitutional: + weight gain/no weight loss, + fatigue, no subjective hyperthermia, no subjective hypothermia Eyes: no blurry vision, no xerophthalmia ENT: no sore throat, + see HPI Cardiovascular: no CP/no SOB/no palpitations/no leg swelling Respiratory: no cough/no SOB/no wheezing Gastrointestinal: no N/no V/no D/no C/no acid reflux Musculoskeletal: no muscle aches/joint aches Skin: no rashes, + hair loss Neurological: no tremors/no numbness/no tingling/no dizziness  I reviewed pt's medications, allergies, PMH, social hx, family hx, and changes were documented in the history of present  illness. Otherwise, unchanged from my initial visit note.  Past Medical History  Diagnosis Date  . Multiple thyroid nodules    Past Surgical History:  Procedure Laterality Date  . CESAREAN SECTION     times  2  . KNEE ARTHROSCOPY     left  knee  . THYROIDECTOMY  03/10/2012   Procedure: THYROIDECTOMY;  Surgeon: Melissa Montane, MD;  Location: [place];  Service: ENT;  Laterality: Bilateral;  Total Bilateral Thyroidectomy   History   Social History  . Marital Status: Married    Spouse Name: N/A    Number of Children: 3    She is a Sport and exercise psychologist.  Social History Main Topics  . Smoking status: Never Smoker   . Smokeless tobacco: Not on file  . Alcohol Use: Yes     Comment: occasionally  . Drug Use: No   Current Outpatient Medications  Medication Sig Dispense Refill  . amphetamine-dextroamphetamine (ADDERALL XR) 20 MG 24 hr capsule TK 1 C PO QD  0  . fexofenadine (ALLEGRA) 180 MG tablet Take 180 mg by mouth daily.    Marland Kitchen gabapentin (NEURONTIN) 300 MG capsule     . montelukast (SINGULAIR) 10 MG tablet     . Multiple Vitamins-Minerals (CENTRUM PO) Take 1 capsule by mouth daily. For Women    . naproxen sodium (ALEVE) 220 MG tablet Take 220 mg by mouth.    . norgestimate-ethinyl estradiol (MONONESSA) 0.25-35 MG-MCG tablet Take 1 tablet by mouth daily.    Marland Kitchen PROAIR HFA 108 (90 BASE) MCG/ACT inhaler     . QVAR 40 MCG/ACT inhaler     . SYNTHROID 112 MCG tablet Take 1 tablet (112 mcg total) by mouth daily before breakfast. 90 tablet 0   No current facility-administered medications for this visit.     Allergies  Allergen Reactions  . Adhesive [Tape]     Patient states that tape used for prior surgeries caused redness of the skin.  Marland Kitchen Penicillins     Respiratory problems (cough)  . Sulfa Antibiotics Rash   FH: Per HPI  PE: There were no vitals taken for this visit. There is no height or weight on file to calculate BMI. Wt Readings from Last 3 Encounters:  03/20/18 156 lb 12.8 oz (71.1 kg)  03/14/17 153 lb (69.4 kg)  01/08/16 155 lb (70.3 kg)   Constitutional:  in NAD  The physical exam was not performed (virtual visit).  ASSESSMENT: 1. Hypothyroidism - postsurgical  2. Fatigue  3. Hair loss  PLAN:  1. Patient  with history of postsurgical hypothyroidism, previously on Armour Thyroid, now on Synthroid d.a.w. - latest thyroid labs reviewed with pt >> TSH was suppressed on 02/15/2019, after which we called her to advise her to decrease the dose of Synthroid to 112 mcg.  She did not get the message at that time, but was informed about the change in plan on 03/14/2019. - she continues on LT4 112 mcg daily - pt feels better on this dose. - we discussed about taking the thyroid hormone every day, with water, >30 minutes before breakfast, separated by >4 hours from acid reflux medications, calcium, iron, multivitamins. Pt. is taking it correctly. - She will need a repeat of her TFTs: TSH and fT4 approximately 5 to 6 weeks after the change in dose - Since she moved to [place], next appointment will either be virtual with me, or in person with a local provider.  2. Fatigue -Increased, however, her TSH has not been  normal and she only recently changed the dose of her levothyroxine -She continues multivitamins, which help  3. Hair loss -She still has clumps of hair coming out -At last visit I recommended B complex >> continues this  [name], MD PhD [facility] Endocrinology

## 2019-03-22 NOTE — Patient Instructions (Addendum)
Please have thyroid labs in ~1.5 months.  Please continue Synthroid 112 mcg daily.  Take the thyroid hormone every day, with water, at least 30 minutes before breakfast, separated by at least 4 hours from: - acid reflux medications - calcium - iron - multivitamins  Please come back in 1 year.

## 2019-04-06 ENCOUNTER — Other Ambulatory Visit: Payer: Self-pay | Admitting: Internal Medicine

## 2019-04-07 LAB — TSH: TSH: 0.143 u[IU]/mL — ABNORMAL LOW (ref 0.450–4.500)

## 2019-04-07 LAB — T4, FREE: Free T4: 1.45 ng/dL (ref 0.82–1.77)

## 2019-04-10 ENCOUNTER — Encounter: Payer: Self-pay | Admitting: Internal Medicine

## 2019-04-11 ENCOUNTER — Other Ambulatory Visit: Payer: Self-pay | Admitting: Internal Medicine

## 2019-04-11 MED ORDER — SYNTHROID 100 MCG PO TABS: 100.0000 ug | ORAL_TABLET | Freq: Every day | ORAL | 3 refills | Status: AC
